# Patient Record
Sex: Female | Born: 1985 | Race: White | Hispanic: No | Marital: Married | Smoking: Never smoker
Health system: Southern US, Community
[De-identification: ages and names within clinical notes are randomized; demographics above are authoritative.]

## PROBLEM LIST (undated history)

## (undated) DIAGNOSIS — Z8742 Personal history of other diseases of the female genital tract: Secondary | ICD-10-CM

## (undated) DIAGNOSIS — N9489 Other specified conditions associated with female genital organs and menstrual cycle: Secondary | ICD-10-CM

## (undated) DIAGNOSIS — N946 Dysmenorrhea, unspecified: Secondary | ICD-10-CM

## (undated) DIAGNOSIS — F32A Depression, unspecified: Secondary | ICD-10-CM

## (undated) DIAGNOSIS — F909 Attention-deficit hyperactivity disorder, unspecified type: Secondary | ICD-10-CM

## (undated) DIAGNOSIS — Z803 Family history of malignant neoplasm of breast: Secondary | ICD-10-CM

## (undated) DIAGNOSIS — F419 Anxiety disorder, unspecified: Secondary | ICD-10-CM

## (undated) DIAGNOSIS — N941 Unspecified dyspareunia: Secondary | ICD-10-CM

## (undated) DIAGNOSIS — K219 Gastro-esophageal reflux disease without esophagitis: Secondary | ICD-10-CM

## (undated) HISTORY — DX: Family history of malignant neoplasm of breast: Z80.3

## (undated) HISTORY — PX: TUBAL LIGATION: SHX77

## (undated) HISTORY — DX: Attention-deficit hyperactivity disorder, unspecified type: F90.9

## (undated) HISTORY — DX: Depression, unspecified: F32.A

---
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
## 2005-10-29 ENCOUNTER — Emergency Department: Payer: Self-pay | Admitting: Emergency Medicine

## 2005-10-29 ENCOUNTER — Other Ambulatory Visit: Payer: Self-pay

## 2006-06-22 ENCOUNTER — Inpatient Hospital Stay: Payer: Self-pay

## 2008-07-11 ENCOUNTER — Other Ambulatory Visit: Payer: Self-pay

## 2008-07-11 ENCOUNTER — Emergency Department: Payer: Self-pay | Admitting: Emergency Medicine

## 2008-07-20 ENCOUNTER — Ambulatory Visit: Payer: Self-pay | Admitting: Family Medicine

## 2008-10-04 ENCOUNTER — Observation Stay: Payer: Self-pay | Admitting: Obstetrics & Gynecology

## 2008-11-23 ENCOUNTER — Observation Stay: Payer: Self-pay

## 2008-12-24 ENCOUNTER — Observation Stay: Payer: Self-pay | Admitting: Obstetrics and Gynecology

## 2009-01-13 ENCOUNTER — Observation Stay: Payer: Self-pay | Admitting: Obstetrics & Gynecology

## 2009-01-15 ENCOUNTER — Observation Stay: Payer: Self-pay | Admitting: Obstetrics and Gynecology

## 2009-01-25 ENCOUNTER — Observation Stay: Payer: Self-pay | Admitting: Obstetrics & Gynecology

## 2009-01-29 ENCOUNTER — Inpatient Hospital Stay: Payer: Self-pay

## 2009-09-08 IMAGING — CR DG CHEST 2V
1 series · 2 of 2 positions shown · non-contrast
Comparison: none

REASON FOR EXAM: Chest pain - Please shield abdomen
COMMENTS:

PROCEDURE:     DXR - DXR CHEST PA (OR AP) AND LATERAL  - July 11, 2008  [DATE]
RESULT:     The lungs are clear. Cardiovascular structures are unremarkable.

[Series 1: view not recorded · 0.17mm/px · 2 of 2 slices shown]
[im 1/2]
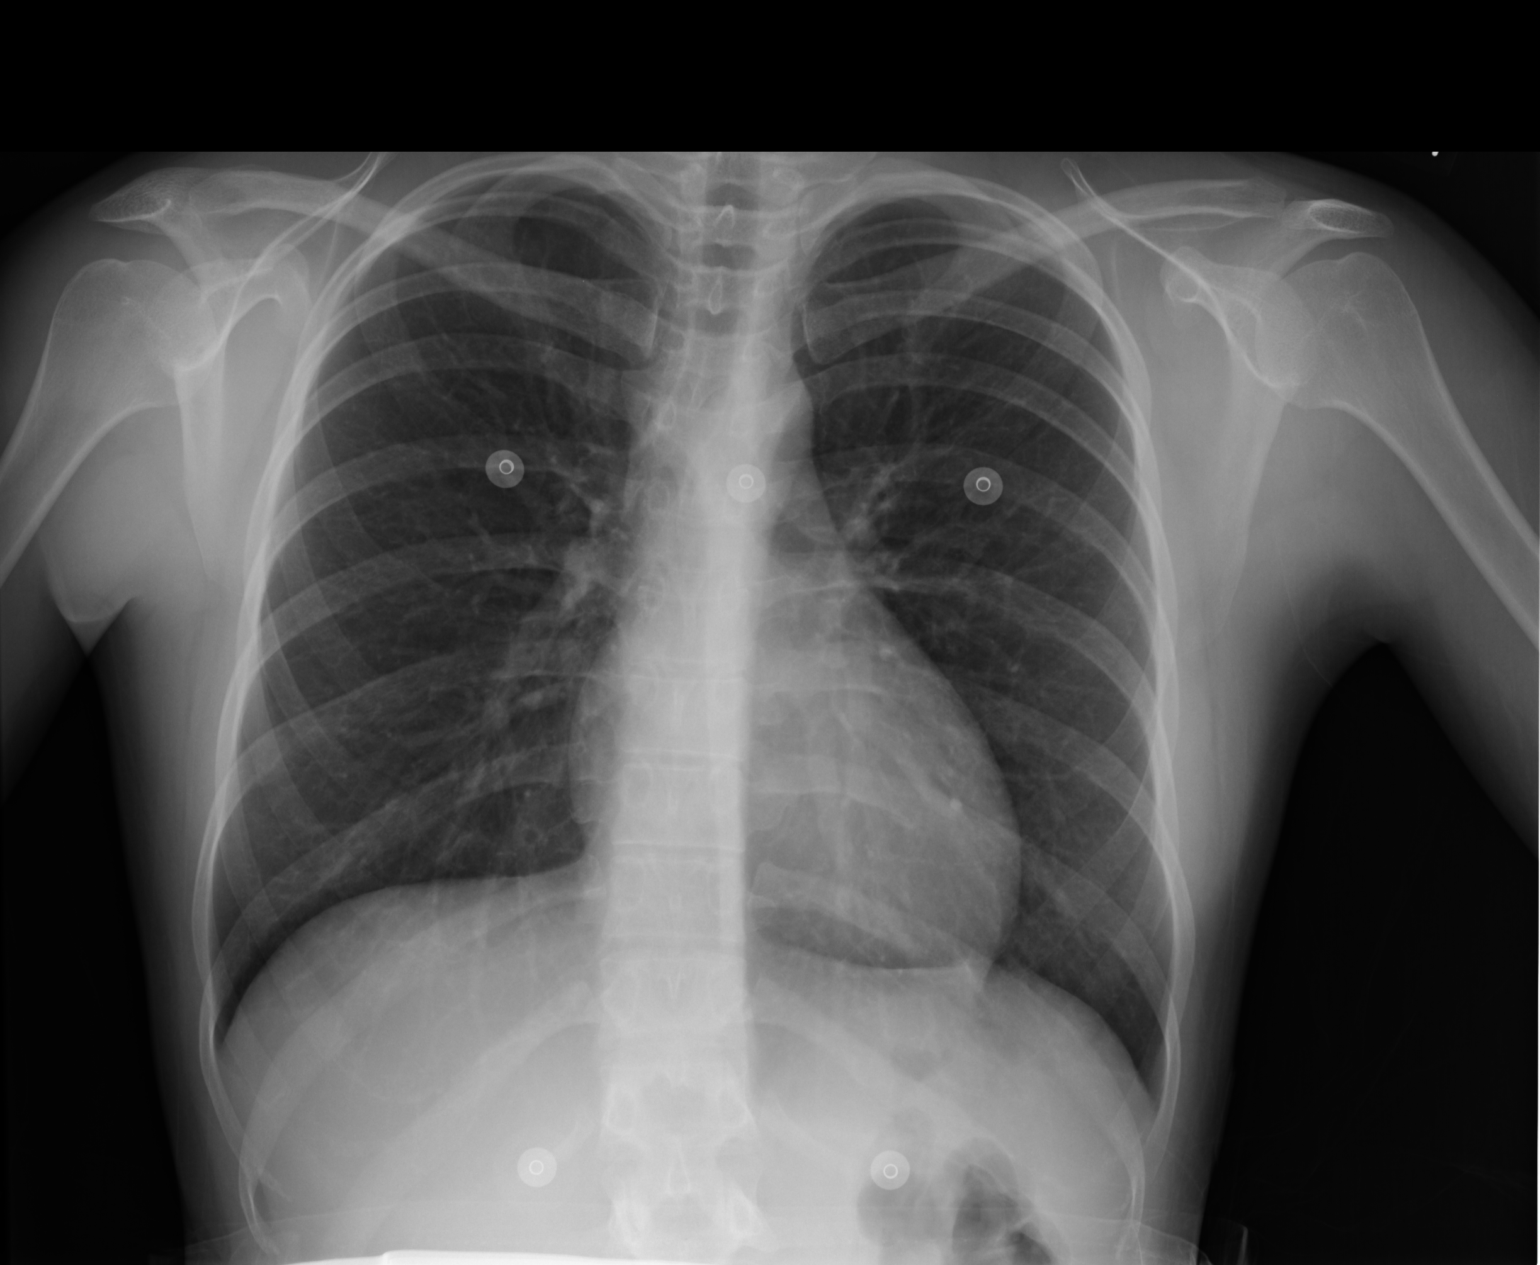
[im 2/2]
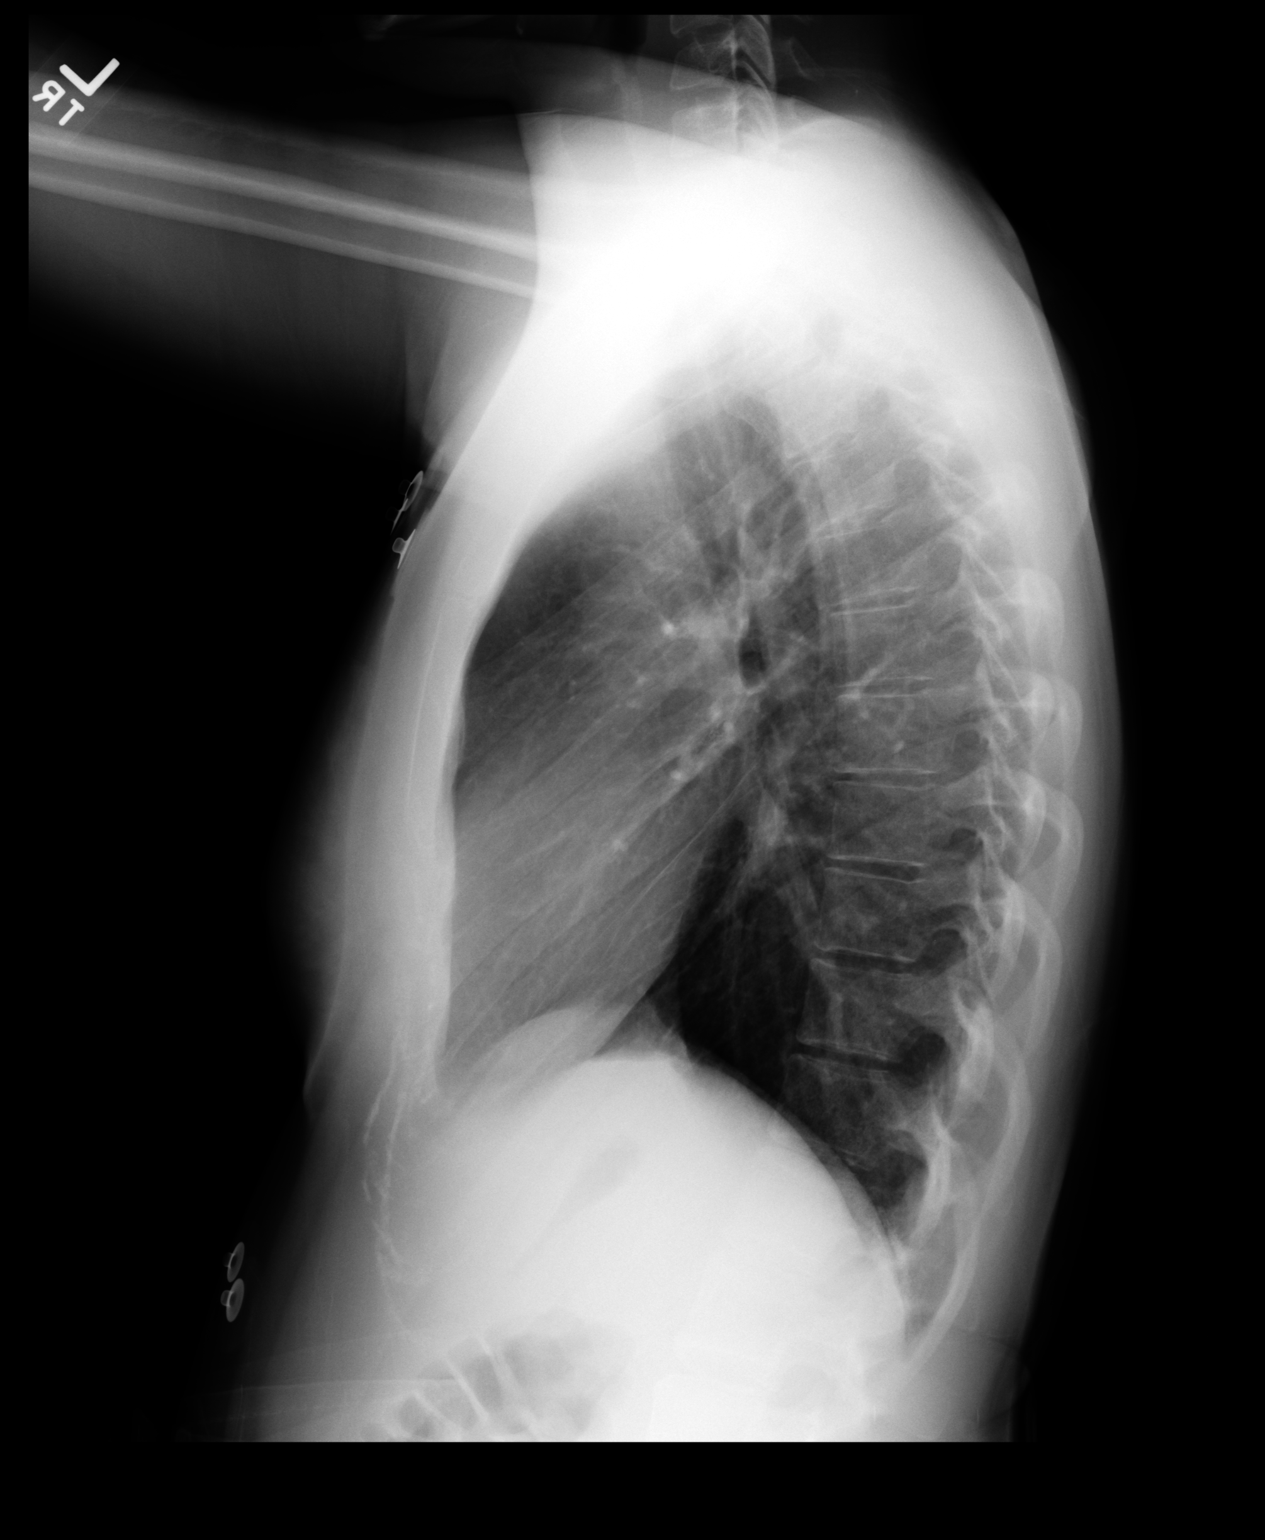

[2 of 2 positions shown; findings below may reference images not displayed]

IMPRESSION: 1.No acute cardiopulmonary disease.

## 2010-06-28 ENCOUNTER — Emergency Department: Payer: Self-pay | Admitting: Emergency Medicine

## 2014-07-30 ENCOUNTER — Emergency Department: Payer: Self-pay | Admitting: Emergency Medicine

## 2014-07-30 LAB — URINALYSIS, COMPLETE
Bacteria: NONE SEEN
Bilirubin,UR: NEGATIVE
Blood: NEGATIVE
Glucose,UR: NEGATIVE mg/dL (ref 0–75)
Ketone: NEGATIVE
Leukocyte Esterase: NEGATIVE
Nitrite: NEGATIVE
Ph: 5 (ref 4.5–8.0)
Protein: NEGATIVE
RBC,UR: 1 /HPF (ref 0–5)
SPECIFIC GRAVITY: 1.021 (ref 1.003–1.030)
Squamous Epithelial: <1
WBC UR: 1 /HPF (ref 0–5)

## 2014-07-30 LAB — CBC WITH DIFFERENTIAL/PLATELET
BASOS ABS: 0.1 10*3/uL (ref 0.0–0.1)
BASOS PCT: 0.6 %
EOS PCT: 1.9 %
Eosinophil #: 0.2 10*3/uL (ref 0.0–0.7)
HCT: 41.1 % (ref 35.0–47.0)
HGB: 13.8 g/dL (ref 12.0–16.0)
LYMPHS PCT: 17.1 %
Lymphocyte #: 1.7 10*3/uL (ref 1.0–3.6)
MCH: 30.5 pg (ref 26.0–34.0)
MCHC: 33.7 g/dL (ref 32.0–36.0)
MCV: 91 fL (ref 80–100)
MONOS PCT: 9.3 %
Monocyte #: 0.9 x10 3/mm (ref 0.2–0.9)
NEUTROS ABS: 6.9 10*3/uL — AB (ref 1.4–6.5)
Neutrophil %: 71.1 %
Platelet: 238 10*3/uL (ref 150–440)
RBC: 4.53 10*6/uL (ref 3.80–5.20)
RDW: 12.9 % (ref 11.5–14.5)
WBC: 9.7 10*3/uL (ref 3.6–11.0)

## 2014-07-30 LAB — BASIC METABOLIC PANEL
Anion Gap: 6 — ABNORMAL LOW (ref 7–16)
BUN: 15 mg/dL (ref 7–18)
CHLORIDE: 105 mmol/L (ref 98–107)
Calcium, Total: 9.3 mg/dL (ref 8.5–10.1)
Co2: 27 mmol/L (ref 21–32)
Creatinine: 0.83 mg/dL (ref 0.60–1.30)
EGFR (African American): >60
EGFR (Non-African Amer.): >60
Glucose: 96 mg/dL (ref 65–99)
Osmolality: 276 (ref 275–301)
Potassium: 3.9 mmol/L (ref 3.5–5.1)
SODIUM: 138 mmol/L (ref 136–145)

## 2014-07-30 LAB — TROPONIN I

## 2017-11-27 ENCOUNTER — Ambulatory Visit
Admission: RE | Admit: 2017-11-27 | Discharge: 2017-11-27 | Disposition: A | Discharge status: Home / Self Care | Payer: Worker's Compensation | Source: Ambulatory Visit | Attending: Physician Assistant | Admitting: Physician Assistant

## 2017-11-27 ENCOUNTER — Other Ambulatory Visit: Payer: Self-pay | Admitting: Physician Assistant

## 2017-11-27 ENCOUNTER — Ambulatory Visit: Admission: EM | Admit: 2017-11-27 | Discharge: 2017-11-27 | Discharge status: Absent without leave | Payer: Self-pay

## 2017-11-27 DIAGNOSIS — R0781 Pleurodynia: Secondary | ICD-10-CM | POA: Insufficient documentation

## 2017-11-27 DIAGNOSIS — X58XXXA Exposure to other specified factors, initial encounter: Secondary | ICD-10-CM | POA: Insufficient documentation

## 2017-11-27 DIAGNOSIS — R937 Abnormal findings on diagnostic imaging of other parts of musculoskeletal system: Secondary | ICD-10-CM | POA: Diagnosis not present

## 2017-11-27 DIAGNOSIS — T1490XA Injury, unspecified, initial encounter: Secondary | ICD-10-CM

## 2018-06-19 DIAGNOSIS — F419 Anxiety disorder, unspecified: Secondary | ICD-10-CM | POA: Insufficient documentation

## 2019-08-04 ENCOUNTER — Ambulatory Visit (INDEPENDENT_AMBULATORY_CARE_PROVIDER_SITE_OTHER): Payer: 59

## 2019-08-04 ENCOUNTER — Other Ambulatory Visit: Payer: Self-pay

## 2019-08-04 ENCOUNTER — Encounter: Payer: Self-pay | Admitting: Emergency Medicine

## 2019-08-04 ENCOUNTER — Ambulatory Visit
Admission: EM | Admit: 2019-08-04 | Discharge: 2019-08-04 | Disposition: A | Discharge status: Home / Self Care | Payer: 59 | Attending: Family Medicine | Admitting: Family Medicine

## 2019-08-04 DIAGNOSIS — M546 Pain in thoracic spine: Secondary | ICD-10-CM | POA: Diagnosis not present

## 2019-08-04 DIAGNOSIS — R0789 Other chest pain: Secondary | ICD-10-CM

## 2019-08-04 DIAGNOSIS — R0781 Pleurodynia: Secondary | ICD-10-CM | POA: Diagnosis not present

## 2019-08-04 HISTORY — DX: Anxiety disorder, unspecified: F41.9

## 2019-08-04 MED ORDER — KETOROLAC TROMETHAMINE 10 MG PO TABS: 10.0000 mg | ORAL_TABLET | Freq: Four times a day (QID) | ORAL | 0 refills | Status: DC | PRN
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
## 2019-08-04 NOTE — Discharge Instructions (Signed)
No acute fracture.  Medication as needed.  Take care  Dr. Lacinda Axon
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
## 2019-08-31 ENCOUNTER — Other Ambulatory Visit: Payer: Self-pay

## 2019-08-31 DIAGNOSIS — Z20822 Contact with and (suspected) exposure to covid-19: Secondary | ICD-10-CM

## 2019-09-02 LAB — NOVEL CORONAVIRUS, NAA: SARS-CoV-2, NAA: NOT DETECTED

## 2019-09-18 ENCOUNTER — Encounter: Payer: Self-pay | Admitting: Emergency Medicine

## 2019-09-18 ENCOUNTER — Other Ambulatory Visit: Payer: Self-pay

## 2019-09-18 ENCOUNTER — Ambulatory Visit
Admission: EM | Admit: 2019-09-18 | Discharge: 2019-09-18 | Disposition: A | Discharge status: Home / Self Care | Payer: 59 | Attending: Family Medicine | Admitting: Family Medicine

## 2019-09-18 DIAGNOSIS — Z20822 Contact with and (suspected) exposure to covid-19: Secondary | ICD-10-CM

## 2019-09-18 DIAGNOSIS — Z20828 Contact with and (suspected) exposure to other viral communicable diseases: Secondary | ICD-10-CM | POA: Diagnosis not present

## 2019-09-18 DIAGNOSIS — Z7189 Other specified counseling: Secondary | ICD-10-CM

## 2019-09-18 NOTE — ED Triage Notes (Signed)
Patient is her for COVID testing for her job.  Patient denies any symptoms

## 2019-09-18 NOTE — ED Provider Notes (Signed)
[place], [place]   Name: Nancy Walls DOB: 08-30-1986 MRN: [id number] CSN: [id number] PCP: Cyndie Mull, DO  Arrival date and time:  09/18/19 1530  Chief Complaint:  COVID exposure   NOTE: Prior to seeing the patient today, I have reviewed the triage nursing documentation and vital signs. Clinical staff has updated patient's PMH/PSHx, current medication list, and drug allergies/intolerances to ensure comprehensive history available to assist in medical decision making.   History:   HPI: Nancy Walls is a 33 y.o. female who presents today with request to be tested for SARS-CoV-2 (novel coronavirus).  Patient presents today with her husband who is symptomatic and being seen for testing today as well. Patient presents today with no symptoms; no cough, fevers, or other symptoms commonly associated with SARS-CoV-2. She advises that she feels generally well. Patient presents for testing out of concern for her personal health. She adds that she is is being required to provide documentation of negative test results before she will be allowed to return to work.   Past Medical History:  Diagnosis Date  . Anxiety     Past Surgical History:  Procedure Laterality Date  . TUBAL LIGATION      Family History  Problem Relation Age of Onset  . Mental illness Mother   . Diabetes Father   . Heart failure Father     Social History   Tobacco Use  . Smoking status: Never Smoker  . Smokeless tobacco: Never Used  Substance Use Topics  . Alcohol use: Never    Frequency: Never  . Drug use: Never    There are no active problems to display for this patient.   Home Medications:    Current Meds  Medication Sig  . busPIRone (BUSPAR) 10 MG tablet TK 1 T PO TID  . doxylamine, Sleep, (UNISOM) 25 MG tablet Take by mouth.    Allergies:   Patient has no known allergies.  Review of Systems (ROS): Review of Systems  Constitutional: Negative for fatigue and fever.  HENT: Negative  for congestion, ear pain, postnasal drip, rhinorrhea, sinus pressure, sinus pain, sneezing and sore throat.   Eyes: Negative for pain, discharge and redness.  Respiratory: Negative for cough, chest tightness and shortness of breath.   Cardiovascular: Negative for chest pain and palpitations.  Gastrointestinal: Negative for abdominal pain, diarrhea, nausea and vomiting.  Musculoskeletal: Negative for arthralgias, back pain, myalgias and neck pain.  Skin: Negative for color change, pallor and rash.  Neurological: Negative for dizziness, syncope, weakness and headaches.  Hematological: Negative for adenopathy.     Vital Signs: Today's Vitals   09/18/19 1541 09/18/19 1542 09/18/19 1618  BP: 103/72    Pulse: 74    Resp: 14    Temp: 98.2 F (36.8 C)    TempSrc: Oral    SpO2: 100%    Weight:  112 lb (50.8 kg)   Height:  5\' 2"  (1.575 m)   PainSc:  0-No pain 0-No pain    Physical Exam: Physical Exam  Constitutional: She is oriented to person, place, and time and well-developed, well-nourished, and in no distress. No distress.  HENT:  Head: Normocephalic and atraumatic.  Right Ear: Tympanic membrane normal.  Left Ear: Tympanic membrane normal.  Nose: Nose normal.  Mouth/Throat: Uvula is midline, oropharynx is clear and moist and mucous membranes are normal.  Eyes: Pupils are equal, round, and reactive to light. Conjunctivae and EOM are normal.  Neck: Normal range of motion. Neck supple.  Cardiovascular: Normal rate, regular rhythm, normal heart sounds and intact distal pulses. Exam reveals no gallop and no friction rub.  No murmur heard. Pulmonary/Chest: Effort normal. No respiratory distress. She has no decreased breath sounds. She has no wheezes. She has no rhonchi. She has no rales.  Abdominal: Soft. Normal appearance and bowel sounds are normal. She exhibits no distension. There is no abdominal tenderness.  Musculoskeletal: Normal range of motion.  Neurological: She is alert and  oriented to person, place, and time. Gait normal.  Skin: Skin is warm and dry. No rash noted. She is not diaphoretic.  Psychiatric: Mood, memory, affect and judgment normal.  Nursing note and vitals reviewed.   Urgent Care Treatments / Results:   LABS: PLEASE NOTE: all labs that were ordered this encounter are listed, however only abnormal results are displayed. Labs Reviewed  NOVEL CORONAVIRUS, NAA (HOSP ORDER, SEND-OUT TO REF LAB; TAT 18-24 HRS)    EKG: -None  RADIOLOGY: No results found.  PROCEDURES: Procedures  MEDICATIONS RECEIVED THIS VISIT: Medications - No data to display  PERTINENT CLINICAL COURSE NOTES/UPDATES:   Initial Impression / Assessment and Plan / Urgent Care Course:  Pertinent labs & imaging results that were available during my care of the patient were personally reviewed by me and considered in my medical decision making (see lab/imaging section of note for values and interpretations).  Nancy Walls is a 33 y.o. female who presents to [facility] today with complaints of COVID exposure   Patient overall well appearing and in no acute distress today in clinic. Presenting symptoms (see HPI) and exam as documented above. She presents following a direct exposure to SARS-CoV-2 (novel coronavirus). Discussed typical symptom constellation. Reviewed potential for infection with recent close contact. Given exposure and potential for infection, testing is reasonable. SARS-CoV-2 swab collected by certified clinical staff. Discussed variable turn around times associated with testing, as swabs are being processed at [facility], and have been between 2-5 days to come back. She was advised to self quarantine, per [facility] DHHS guidelines, until negative results received.   Current clinical condition warrants patient being out of work in order to quarantine while waiting for testing results. She was provided with the appropriate documentation to provide to her place of  employment that will allow for her to RTW on 09/20/2019 with no restrictions. RTW is contingent on her SARS-CoV-2 test results being reviewed as negative. Additionally, if patient's husband tests (+) for the virus, her quarantine period will need to be extended as there is close continuous contact.   Discussed follow up with primary care physician should she develop any concerning symptoms. I have reviewed the follow up and strict return precautions for any new or worsening symptoms. Patient is aware of symptoms that would be deemed urgent/emergent, and would thus require further evaluation either here or in the emergency department. At the time of discharge, he verbalized understanding and consent with the discharge plan as it was reviewed with him. All questions were fielded by provider and/or clinic staff prior to patient discharge.    Final Clinical Impressions / Urgent Care Diagnoses:   Final diagnoses:  Encounter for laboratory testing for COVID-19 virus  Advice given about COVID-19 virus infection  Exposure to COVID-19 virus    New Prescriptions:  [place] Controlled Substance Registry consulted? Not Applicable  No orders of the defined types were placed in this encounter.   Recommended Follow up Care:  Patient encouraged to follow up with the following provider within  the specified time frame, or sooner as dictated by the severity of her symptoms. As always, she was instructed that for any urgent/emergent care needs, she should seek care either here or in the emergency department for more immediate evaluation.  Follow-up Information    Cyndie MullSantayana, Gloria Patricia, DO.   Specialty: Family Medicine Why: As needed Contact information: [address] [place] [id number] [phone]         NOTE: This note was prepared using Dragon dictation software along with smaller phrase technology. Despite my best ability to proofread, there is the potential that transcriptional errors may still  occur from this process, and are completely unintentional.    Verlee MonteGray, Aleeta Schmaltz E, NP 09/18/19 [phone]

## 2019-09-18 NOTE — Discharge Instructions (Signed)
It was very nice seeing you today in clinic. Thank you for entrusting me with your care.  ° °You were tested for SARS-CoV-2 (novel coronavirus) today. Testing is performed by an outside lab ([facility]) and has variable turn around times ranging between 2-5 days. Current recommendations from the the CDC and [place] DHHS require that you remain out of work in order to quarantine at home until negative test results are have been received. In the event that your test results are positive, you will be contacted with further directives. These measures are being implemented out of an abundance of caution to prevent transmission and spread during the current SARS-CoV-2 pandemic. ° °If you develop any symptoms or concerns, make arrangements to follow up with your regular doctor. If your symptoms are severe, please seek follow up care in the ER. Please remember, our [place] providers are "right here with you" when you need us.  ° °Again, it was my pleasure to take care of you today. Thank you for choosing our clinic. I hope that you start to feel better quickly.  ° °Lynnie Koehler, MSN, APRN, FNP-C, CEN °Advanced Practice Provider °[facility] [place] [facility] °

## 2019-09-19 LAB — NOVEL CORONAVIRUS, NAA (HOSP ORDER, SEND-OUT TO REF LAB; TAT 18-24 HRS): SARS-CoV-2, NAA: NOT DETECTED

## 2019-11-26 ENCOUNTER — Ambulatory Visit
Admission: EM | Admit: 2019-11-26 | Discharge: 2019-11-26 | Disposition: A | Discharge status: Home / Self Care | Payer: 59 | Attending: Family Medicine | Admitting: Family Medicine

## 2019-11-26 ENCOUNTER — Other Ambulatory Visit: Payer: Self-pay

## 2019-11-26 DIAGNOSIS — Z20822 Contact with and (suspected) exposure to covid-19: Secondary | ICD-10-CM | POA: Diagnosis not present

## 2019-11-26 NOTE — ED Provider Notes (Signed)
[facility] ____________________________________________  Time seen: Approximately 2:30 PM  I have reviewed the triage vital signs and the nursing notes.   HISTORY  Chief Complaint COVID exposure   HPI Nancy Walls is a 34 y.o. female present for COVID-19 testing.  Patient reports she is feeling well.  Denies cough, congestion, sore throat, fevers, chest pain, shortness of breath, vomiting, diarrhea or changes in taste and smell.  Patient reports her husband has been sick for approximately 1 week with possible Covid symptoms and is currently awaiting his test result from being swabbed yesterday.  Reports that she feels well.  Denies aggravating or alleviating factors.  Patient's last menstrual period was 11/14/2019.  Denies pregnancy.    Past Medical History:  Diagnosis Date  . Anxiety     There are no problems to display for this patient.   Past Surgical History:  Procedure Laterality Date  . TUBAL LIGATION       No current facility-administered medications for this encounter.  Current Outpatient Medications:  .  busPIRone (BUSPAR) 10 MG tablet, TK 1 T PO TID, Disp: , Rfl:  .  doxylamine, Sleep, (UNISOM) 25 MG tablet, Take by mouth., Disp: , Rfl:   Allergies Patient has no known allergies.  Family History  Problem Relation Age of Onset  . Mental illness Mother   . Diabetes Father   . Heart failure Father     Social History Social History   Tobacco Use  . Smoking status: Never Smoker  . Smokeless tobacco: Never Used  Substance Use Topics  . Alcohol use: Never  . Drug use: Never    Review of Systems Constitutional: No fever ENT: No sore throat. Cardiovascular: Denies chest pain. Respiratory: Denies shortness of breath. Gastrointestinal: No abdominal pain.  No nausea, no vomiting.  No diarrhea.   Musculoskeletal: Negative for back pain. Skin: Negative for rash.   ____________________________________________   PHYSICAL  EXAM:  VITAL SIGNS: ED Triage Vitals  Enc Vitals Group     BP 11/26/19 1317 101/64     Pulse Rate 11/26/19 1317 69     Resp --      Temp 11/26/19 1317 98.6 F (37 C)     Temp Source 11/26/19 1317 Oral     SpO2 11/26/19 1317 100 %     Weight 11/26/19 1314 112 lb (50.8 kg)     Height 11/26/19 1314 5\' 2"  (1.575 m)     Head Circumference --      Peak Flow --      Pain Score 11/26/19 1314 0     Pain Loc --      Pain Edu? --      Excl. in [place]? --     Constitutional: Alert and oriented. Well appearing and in no acute distress. Eyes: Conjunctivae are normal. ENT      Head: Normocephalic and atraumatic. Cardiovascular: Normal rate, regular rhythm. Grossly normal heart sounds.  Good peripheral circulation. Respiratory: Normal respiratory effort without tachypnea nor retractions. Breath sounds are clear and equal bilaterally. No wheezes, rales, rhonchi. Musculoskeletal: Steady gait. Neurologic:  Normal speech and language. Speech is normal. No gait instability.  Skin:  Skin is warm, dry and intact. No rash noted. Psychiatric: Mood and affect are normal. Speech and behavior are normal. Patient exhibits appropriate insight and judgment   ___________________________________________   LABS (all labs ordered are listed, but only abnormal results are displayed)  Labs Reviewed  NOVEL CORONAVIRUS, NAA (HOSP ORDER, SEND-OUT TO REF LAB;  TAT 18-24 HRS)     PROCEDURES Procedures   INITIAL IMPRESSION / ASSESSMENT AND PLAN / ED COURSE  Pertinent labs & imaging results that were available during my care of the patient were reviewed by me and considered in my medical decision making (see chart for details).  Well-appearing patient.  Asymptomatic.  COVID-19 testing completed advice given.  Monitor and supportive care.  Work note given.  Discussed follow up and return parameters including no resolution or any worsening concerns. Patient verbalized understanding and agreed to plan.    ____________________________________________   FINAL CLINICAL IMPRESSION(S) / ED DIAGNOSES  Final diagnoses:  Encounter for screening laboratory testing for COVID-19 virus     ED Discharge Orders    None       Note: This dictation was prepared with Dragon dictation along with smaller phrase technology. Any transcriptional errors that result from this process are unintentional.         Renford Dills, NP 11/26/19 1431

## 2019-11-26 NOTE — ED Triage Notes (Signed)
Pt presents for COVID testing. Her husband is symptomatic and was tested yesterday. She does not currently have any symptoms. She reports her husband has been sick for about one week.

## 2019-11-27 LAB — NOVEL CORONAVIRUS, NAA (HOSP ORDER, SEND-OUT TO REF LAB; TAT 18-24 HRS): SARS-CoV-2, NAA: NOT DETECTED

## 2020-11-30 ENCOUNTER — Encounter: Payer: Self-pay | Admitting: Emergency Medicine

## 2020-11-30 ENCOUNTER — Other Ambulatory Visit: Payer: Self-pay

## 2020-11-30 ENCOUNTER — Ambulatory Visit
Admission: EM | Admit: 2020-11-30 | Discharge: 2020-11-30 | Disposition: A | Discharge status: Home / Self Care | Payer: 59 | Attending: Physician Assistant | Admitting: Physician Assistant

## 2020-11-30 DIAGNOSIS — R110 Nausea: Secondary | ICD-10-CM | POA: Diagnosis present

## 2020-11-30 DIAGNOSIS — R1032 Left lower quadrant pain: Secondary | ICD-10-CM | POA: Insufficient documentation

## 2020-11-30 DIAGNOSIS — R300 Dysuria: Secondary | ICD-10-CM | POA: Diagnosis present

## 2020-11-30 DIAGNOSIS — Z20822 Contact with and (suspected) exposure to covid-19: Secondary | ICD-10-CM | POA: Diagnosis not present

## 2020-11-30 DIAGNOSIS — R1031 Right lower quadrant pain: Secondary | ICD-10-CM | POA: Diagnosis not present

## 2020-11-30 DIAGNOSIS — R0989 Other specified symptoms and signs involving the circulatory and respiratory systems: Secondary | ICD-10-CM | POA: Diagnosis not present

## 2020-11-30 LAB — URINALYSIS, COMPLETE (UACMP) WITH MICROSCOPIC
Bilirubin Urine: NEGATIVE
Glucose, UA: NEGATIVE mg/dL
Ketones, ur: NEGATIVE mg/dL
Nitrite: NEGATIVE
Protein, ur: NEGATIVE mg/dL
Specific Gravity, Urine: 1.015 (ref 1.005–1.030)
pH: 6.5 (ref 5.0–8.0)

## 2020-11-30 MED ORDER — ONDANSETRON HCL 8 MG PO TABS: 8.0000 mg | ORAL_TABLET | Freq: Three times a day (TID) | ORAL | 0 refills | Status: DC | PRN

## 2020-11-30 MED ORDER — NITROFURANTOIN MONOHYD MACRO 100 MG PO CAPS: 100.0000 mg | ORAL_CAPSULE | Freq: Two times a day (BID) | ORAL | 0 refills | Status: AC

## 2020-11-30 NOTE — ED Triage Notes (Signed)
Patient c/o lower abdominal pain and dysuria that started a week ago.  Patient reports runny nose and Has that started 3 days ago.  Patient denies fevers.

## 2020-11-30 NOTE — Discharge Instructions (Addendum)
Recommend start Macrobid 100mg  (antibiotic) twice a day as directed. May take Zofran 8mg  every 8 hours as needed for nausea. Recommend increase fluids, particularly water or Cranberry juice, to help with symptoms. Rest. Stay at home. May also take Ibuprofen 600mg  every 8 hours as needed for pain. Follow-up pending COVID 19 test results and your urine culture results.

## 2020-11-30 NOTE — ED Provider Notes (Signed)
[facility]    CSN: [id number] Arrival date & time: 11/30/20  1009      History   Chief Complaint Chief Complaint  Patient presents with  . Abdominal Pain  . Dysuria    HPI Nancy Walls is a 35 y.o. female.   36 year old female presents with lower abdominal pain and discomfort when urinating for about 1 week. Improved slightly when taking AZO but now getting worse. Also having some nausea but no vomiting or diarrhea. Has noticed slight increase in vaginal discharge but no distinct odor. LMP 12/23 and regular. Has had a tubal ligation. Last UTI over 12 years ago. Also concerned over possible COVID 19 since she started having a runny nose and congestion about 3 days ago. Denies any fever, sore throat or cough. Multiple people positive for COVID 19 at her work. Has been vaccinated. Other chronic health issues include insomnia. Currently takes OTC Doxylamine (Unisom) daily.   The history is provided by the patient.    Past Medical History:  Diagnosis Date  . Anxiety     There are no problems to display for this patient.   Past Surgical History:  Procedure Laterality Date  . TUBAL LIGATION      OB History   No obstetric history on file.      Home Medications    Prior to Admission medications   Medication Sig Start Date End Date Taking? Authorizing Provider  doxylamine, Sleep, (UNISOM) 25 MG tablet Take by mouth.   Yes [provider]  nitrofurantoin, macrocrystal-monohydrate, (MACROBID) 100 MG capsule Take 1 capsule (100 mg total) by mouth 2 (two) times daily for 5 days. 11/30/20 12/05/20 Yes Hayzlee Mcsorley, Ali Lowe, NP  ondansetron (ZOFRAN) 8 MG tablet Take 1 tablet (8 mg total) by mouth every 8 (eight) hours as needed for nausea or vomiting. 11/30/20  Yes Delonda Coley, Ali Lowe, NP    Family History Family History  Problem Relation Age of Onset  . Mental illness Mother   . Diabetes Father   . Heart failure Father     Social History Social History    Tobacco Use  . Smoking status: Never Smoker  . Smokeless tobacco: Never Used  Vaping Use  . Vaping Use: Never used  Substance Use Topics  . Alcohol use: Never  . Drug use: Never     Allergies   Patient has no known allergies.   Review of Systems Review of Systems  Constitutional: Positive for appetite change, chills and fatigue. Negative for diaphoresis and fever.  HENT: Positive for congestion, postnasal drip and rhinorrhea. Negative for ear discharge, ear pain, facial swelling, mouth sores, sinus pressure, sinus pain, sore throat and trouble swallowing.   Eyes: Negative for pain, discharge, redness and itching.  Respiratory: Negative for cough, chest tightness, shortness of breath and wheezing.   Gastrointestinal: Positive for abdominal pain and nausea. Negative for diarrhea and vomiting.  Genitourinary: Positive for decreased urine volume, dysuria and frequency. Negative for flank pain, genital sores, hematuria, menstrual problem, pelvic pain, urgency, vaginal bleeding and vaginal pain.  Musculoskeletal: Positive for back pain. Negative for arthralgias, neck pain and neck stiffness.  Skin: Negative for color change and rash.  Allergic/Immunologic: Negative for environmental allergies, food allergies and immunocompromised state.  Neurological: Positive for weakness, light-headedness and headaches. Negative for dizziness, tremors, seizures, syncope and numbness.  Hematological: Negative for adenopathy. Does not bruise/bleed easily.  Psychiatric/Behavioral: Positive for sleep disturbance.     Physical Exam Triage Vital Signs  ED Triage Vitals  Enc Vitals Group     BP 11/30/20 1045 117/77     Pulse Rate 11/30/20 1045 73     Resp 11/30/20 1045 14     Temp 11/30/20 1045 98 F (36.7 C)     Temp Source 11/30/20 1045 Oral     SpO2 11/30/20 1045 100 %     Weight 11/30/20 1042 112 lb (50.8 kg)     Height 11/30/20 1042 5\' 2"  (1.575 m)     Head Circumference --      Peak Flow  --      Pain Score 11/30/20 1042 0     Pain Loc --      Pain Edu? --      Excl. in GC? --    No data found.  Updated Vital Signs BP 117/77 (BP Location: Left Arm)   Pulse 73   Temp 98 F (36.7 C) (Oral)   Resp 14   Ht 5\' 2"  (1.575 m)   Wt 112 lb (50.8 kg)   LMP 11/09/2020 (Exact Date)   SpO2 100%   BMI 20.49 kg/m   Visual Acuity Right Eye Distance:   Left Eye Distance:   Bilateral Distance:    Right Eye Near:   Left Eye Near:    Bilateral Near:     Physical Exam Vitals and nursing note reviewed.  Constitutional:      General: She is awake. She is not in acute distress.    Appearance: She is well-developed and well-groomed.     Comments: She is sitting on the exam table in no acute distress but appears uncomfortable due to pain and congestion.   HENT:     Head: Normocephalic and atraumatic.     Right Ear: Hearing, tympanic membrane, ear canal and external ear normal.     Left Ear: Hearing, tympanic membrane, ear canal and external ear normal.     Nose: Congestion present.     Right Turbinates: Swollen.     Left Turbinates: Swollen.     Right Sinus: No maxillary sinus tenderness or frontal sinus tenderness.     Left Sinus: No maxillary sinus tenderness or frontal sinus tenderness.     Mouth/Throat:     Lips: Pink.     Mouth: Mucous membranes are moist.     Pharynx: Oropharynx is clear. Uvula midline. No pharyngeal swelling, oropharyngeal exudate, posterior oropharyngeal erythema or uvula swelling.  Eyes:     Extraocular Movements: Extraocular movements intact.     Conjunctiva/sclera: Conjunctivae normal.  Cardiovascular:     Rate and Rhythm: Normal rate and regular rhythm.     Heart sounds: Normal heart sounds. No murmur heard.   Pulmonary:     Effort: Pulmonary effort is normal. No respiratory distress.     Breath sounds: Normal breath sounds and air entry. No decreased air movement. No decreased breath sounds, wheezing, rhonchi or rales.  Abdominal:      General: Abdomen is flat. Bowel sounds are normal.     Palpations: Abdomen is soft.     Tenderness: There is abdominal tenderness in the right lower quadrant, suprapubic area and left lower quadrant. There is left CVA tenderness. There is no right CVA tenderness, guarding or rebound.    Musculoskeletal:        General: Normal range of motion.     Cervical back: Normal range of motion and neck supple. No rigidity.  Lymphadenopathy:     Cervical: No cervical adenopathy.  Skin:    General: Skin is warm and dry.     Capillary Refill: Capillary refill takes less than 2 seconds.     Findings: No rash.  Neurological:     General: No focal deficit present.     Mental Status: She is alert and oriented to person, place, and time.  Psychiatric:        Mood and Affect: Mood normal.        Behavior: Behavior normal. Behavior is cooperative.        Thought Content: Thought content normal.        Judgment: Judgment normal.      UC Treatments / Results  Labs (all labs ordered are listed, but only abnormal results are displayed) Labs Reviewed  URINALYSIS, COMPLETE (UACMP) WITH MICROSCOPIC - Abnormal; Notable for the following components:      Result Value   Hgb urine dipstick TRACE (*)    Leukocytes,Ua SMALL (*)    Bacteria, UA FEW (*)    All other components within normal limits  SARS CORONAVIRUS 2 (TAT 6-24 HRS)  URINE CULTURE    EKG   Radiology No results found.  Procedures Procedures (including critical care time)  Medications Ordered in UC Medications - No data to display  Initial Impression / Assessment and Plan / UC Course  I have reviewed the triage vital signs and the nursing notes.  Pertinent labs & imaging results that were available during my care of the patient were reviewed by me and considered in my medical decision making (see chart for details).    Reviewed urinalysis results with patient- possible UTI. Will start Macrobid 100mg  twice a day as directed. Will  send urine for culture. May take Zofran 8mg  every 8 hours as needed for nausea. Recommend increase fluids, especially water to help with symptoms. May take Ibuprofen 600mg  every 8 hours as needed for pain. Rest. Stay at home. Note written for work. Follow-up pending urine culture results and COVID 19 test results.  Final Clinical Impressions(s) / UC Diagnoses   Final diagnoses:  Dysuria  Acute bilateral lower abdominal pain  Runny nose  Nausea without vomiting     Discharge Instructions     Recommend start Macrobid 100mg  (antibiotic) twice a day as directed. May take Zofran 8mg  every 8 hours as needed for nausea. Recommend increase fluids, particularly water or Cranberry juice, to help with symptoms. Rest. Stay at home. May also take Ibuprofen 600mg  every 8 hours as needed for pain. Follow-up pending COVID 19 test results and your urine culture results.     ED Prescriptions    Medication Sig Dispense Auth. Provider   nitrofurantoin, macrocrystal-monohydrate, (MACROBID) 100 MG capsule Take 1 capsule (100 mg total) by mouth 2 (two) times daily for 5 days. 10 capsule , NP   ondansetron (ZOFRAN) 8 MG tablet Take 1 tablet (8 mg total) by mouth every 8 (eight) hours as needed for nausea or vomiting. 20 tablet Kavari Parrillo, , NP     PDMP not reviewed this encounter.   , NP 11/30/20 2120

## 2020-12-01 LAB — SARS CORONAVIRUS 2 (TAT 6-24 HRS): SARS Coronavirus 2: NEGATIVE

## 2020-12-03 LAB — URINE CULTURE
Culture: >=100000 — AB
Special Requests: NORMAL

## 2022-04-29 DIAGNOSIS — Z8742 Personal history of other diseases of the female genital tract: Secondary | ICD-10-CM | POA: Insufficient documentation

## 2022-07-05 DIAGNOSIS — N926 Irregular menstruation, unspecified: Secondary | ICD-10-CM | POA: Insufficient documentation

## 2022-07-05 DIAGNOSIS — N9489 Other specified conditions associated with female genital organs and menstrual cycle: Secondary | ICD-10-CM | POA: Insufficient documentation

## 2022-08-14 HISTORY — PX: OTHER SURGICAL HISTORY: SHX169

## 2023-12-22 ENCOUNTER — Telehealth: Payer: Self-pay | Admitting: Family Medicine

## 2023-12-22 NOTE — Telephone Encounter (Signed)
 Created in error

## 2024-01-16 ENCOUNTER — Encounter: Payer: Self-pay | Admitting: Family Medicine

## 2024-01-16 ENCOUNTER — Ambulatory Visit (INDEPENDENT_AMBULATORY_CARE_PROVIDER_SITE_OTHER): Payer: BC Managed Care – PPO | Admitting: Family Medicine

## 2024-01-16 VITALS — BP 105/70 | HR 71 | Temp 98.8°F | Resp 18 | Ht 62.0 in | Wt 114.0 lb

## 2024-01-16 DIAGNOSIS — N9489 Other specified conditions associated with female genital organs and menstrual cycle: Secondary | ICD-10-CM

## 2024-01-16 DIAGNOSIS — Z7689 Persons encountering health services in other specified circumstances: Secondary | ICD-10-CM

## 2024-01-16 DIAGNOSIS — F5101 Primary insomnia: Secondary | ICD-10-CM

## 2024-01-16 DIAGNOSIS — N926 Irregular menstruation, unspecified: Secondary | ICD-10-CM

## 2024-01-16 MED ORDER — TRAZODONE HCL 50 MG PO TABS: 25.0000 mg | ORAL_TABLET | Freq: Every day | ORAL | 0 refills | Status: DC

## 2024-01-16 NOTE — Patient Instructions (Signed)

## 2024-01-16 NOTE — Progress Notes (Signed)
 New Patient Office Visit  Subjective   Patient ID: Nancy Walls, female    DOB: 08-03-1986  Age: 38 y.o. MRN: [id number]  CC:  Chief Complaint  Patient presents with   Establish Care   HPI Nancy Walls is a 38 year old female who presents to establish with [facility] at [facility].   CC: Patient here to establish care  Last PCP: [facility] Specialists: OBGYN PMH: anxiety, depression,  irregular menstrual cycles, history of ovarian cysts, and pelvic congestion syndrome   [facility]- endometriosis  Enlarged vein through uterus- embolization  Still has 80% of the issues  R ovary is the worst 2/10 pain  Week before and after menstrual cycle  History of ruptured ovarian cysts   Sleep- currently taking double the dose of recommended Unisom  Trouble falling asleep  She did cut out a lot of caffeine but has not helped. No longer consumes caffeine in the afternoon.      01/16/2024    9:49 AM  GAD 7 : Generalized Anxiety Score  Nervous, Anxious, on Edge 0  Control/stop worrying 0  Worry too much - different things 0  Trouble relaxing 0  Restless 0  Easily annoyed or irritable 0  Afraid - awful might happen 0  Total GAD 7 Score 0  Anxiety Difficulty Not difficult at all       01/16/2024    9:49 AM  PHQ9 SCORE ONLY  PHQ-9 Total Score 4    Outpatient Encounter Medications as of 01/16/2024  Medication Sig   doxylamine, Sleep, (UNISOM) 25 MG tablet Take by mouth.   traZODone (DESYREL) 50 MG tablet Take 0.5 tablets (25 mg total) by mouth at bedtime.   [DISCONTINUED] ondansetron (ZOFRAN) 8 MG tablet Take 1 tablet (8 mg total) by mouth every 8 (eight) hours as needed for nausea or vomiting.   No facility-administered encounter medications on file as of 01/16/2024.    Patient Active Problem List   Diagnosis Date Noted   Irregular periods/menstrual cycles 07/05/2022   Pelvic congestion syndrome 07/05/2022   History of ovarian cyst 04/29/2022   Anxiety and depression  06/19/2018   Past Medical History:  Diagnosis Date   Anxiety    Past Surgical History:  Procedure Laterality Date   TUBAL LIGATION     Family History  Problem Relation Age of Onset   Mental illness Mother    Diabetes Father    Heart failure Father    Social History   Socioeconomic History   Marital status: Married    Spouse name: Not on file   Number of children: Not on file   Years of education: Not on file   Highest education level: Not on file  Occupational History   Not on file  Tobacco Use   Smoking status: Never    Passive exposure: Never   Smokeless tobacco: Never  Vaping Use   Vaping status: Never Used  Substance and Sexual Activity   Alcohol use: Never   Drug use: Never   Sexual activity: Not on file    Comment: tubal ligation  Other Topics Concern   Not on file  Social History Narrative   Not on file   Social Drivers of Health   Financial Resource Strain: Low Risk  (04/29/2022)   Received from [facility], [facility]   Overall Financial Resource Strain (CARDIA)    Difficulty of Paying Living Expenses: Not hard at all  Food Insecurity: No Food Insecurity (04/29/2022)  Received from [facility], [facility]   Hunger Vital Sign    Worried About Running Out of Food in the Last Year: Never true    Ran Out of Food in the Last Year: Never true  Transportation Needs: No Transportation Needs (04/29/2022)   Received from [facility], [facility]   [facility] - Transportation    Lack of Transportation (Medical): No    Lack of Transportation (Non-Medical): No  Physical Activity: Not on file  Stress: Not on file  Social Connections: Not on file  Intimate Partner Violence: Not on file   Outpatient Medications Prior to Visit  Medication Sig Dispense Refill   doxylamine, Sleep, (UNISOM) 25 MG tablet Take by mouth.     ondansetron (ZOFRAN) 8 MG tablet Take 1 tablet (8 mg total) by mouth every 8 (eight) hours as needed for nausea or  vomiting. 20 tablet 0   No facility-administered medications prior to visit.   No Known Allergies   ROS      Objective   Today's Vitals   01/16/24 0944  BP: 105/70  Pulse: 71  Resp: 18  Temp: 98.8 F (37.1 C)  TempSrc: Oral  SpO2: 99%  Weight: 114 lb (51.7 kg)  Height: 5\' 2"  (1.575 m)  PainSc: 1   PainLoc: Abdomen    GENERAL: Well-appearing, in NAD. Well nourished.  SKIN: Pink, warm and dry. No rash, lesion, ulceration, or ecchymoses.  Head: Normocephalic. RESPIRATORY: Chest wall symmetrical. Respirations even and non-labored. Breath sounds clear to auscultation bilaterally.  CARDIAC: S1, S2 present, regular rate and rhythm without murmur or gallops. Peripheral pulses 2+ bilaterally.  MSK: Muscle tone and strength appropriate for age. Joints w/o tenderness, redness, or swelling.  EXTREMITIES: Without clubbing, cyanosis, or edema.  NEUROLOGIC: No motor or sensory deficits. Steady, even gait. C2-C12 intact.  PSYCH/MENTAL STATUS: Alert, oriented x 3. Cooperative, appropriate mood and affect.     Assessment & Plan:   1. Encounter to establish care (Primary) Patient is a 38- year-old female who presents today to establish care with primary care at [facility]. Reviewed the past medical history, family history, social history, surgical history, medications and allergies today- updates made as indicated. Patient has concerns today about insomnia and ongoing abdominal pain due to pelvic congestion syndrome.   2. Primary insomnia Discussed adequate sleep hygiene routine, limiting caffeine, stress management, and use of trazodone as needed. Patient is agreeable to plan of care. Reports she was on Ambien in the past and did not like the side effects from this medication. Rx sent to pharmacy. Follow-up in 4-6 weeks.  - traZODone (DESYREL) 50 MG tablet; Take 0.5 tablets (25 mg total) by mouth at bedtime.  Dispense: 45 tablet; Refill: 0  3. Pelvic congestion syndrome Extensive  history with metrorrhagia and abdominal pain. Review of notes from [facility]- diagnosed via CT scan on 06/23/2022. Patient underwent embolization of an enlarged vein through uterus but reports she still experiences abdominal and ovarian pain on a daily basis. Patient would like to undergo hysterectomy. Referral to OBGYN placed.  - Ambulatory referral to Obstetrics / Gynecology  4. Irregular periods/menstrual cycles History of ovarian cysts and irregular menstrual cycles. OBGYN referral placed.  - Ambulatory referral to Obstetrics / Gynecology   Return in about 6 weeks (around 02/27/2024) for sleep & mood f/u .   Alyson Reedy, FNP

## 2024-02-25 NOTE — Progress Notes (Unsigned)
    Alyson Reedy, FNP   No chief complaint on file.   HPI:      [name] is a 38 y.o. No obstetric history on file. whose LMP was No LMP recorded., presents today for NP eval Neg pap 6/23 [facility] ???? HPV   Patient Active Problem List   Diagnosis Date Noted   Irregular periods/menstrual cycles 07/05/2022   Pelvic congestion syndrome 07/05/2022   History of ovarian cyst 04/29/2022   Anxiety and depression 06/19/2018    Past Surgical History:  Procedure Laterality Date   TUBAL LIGATION      Family History  Problem Relation Age of Onset   Mental illness Mother    Diabetes Father    Heart failure Father     Social History   Socioeconomic History   Marital status: Married    Spouse name: Not on file   Number of children: Not on file   Years of education: Not on file   Highest education level: Not on file  Occupational History   Not on file  Tobacco Use   Smoking status: Never    Passive exposure: Never   Smokeless tobacco: Never  Vaping Use   Vaping status: Never Used  Substance and Sexual Activity   Alcohol use: Never   Drug use: Never   Sexual activity: Not on file    Comment: tubal ligation  Other Topics Concern   Not on file  Social History Narrative   Not on file   Social Drivers of Health   Financial Resource Strain: Low Risk  (04/29/2022)   Received from [facility], [facility]   Overall Financial Resource Strain (CARDIA)    Difficulty of Paying Living Expenses: Not hard at all  Food Insecurity: No Food Insecurity (04/29/2022)   Received from [facility], [facility]   Hunger Vital Sign    Worried About Running Out of Food in the Last Year: Never true    Ran Out of Food in the Last Year: Never true  Transportation Needs: No Transportation Needs (04/29/2022)   Received from [facility], [facility]   [facility] - Transportation    Lack of Transportation (Medical): No    Lack of Transportation (Non-Medical): No   Physical Activity: Not on file  Stress: Not on file  Social Connections: Not on file  Intimate Partner Violence: Not on file    Outpatient Medications Prior to Visit  Medication Sig Dispense Refill   doxylamine, Sleep, (UNISOM) 25 MG tablet Take by mouth.     traZODone (DESYREL) 50 MG tablet Take 0.5 tablets (25 mg total) by mouth at bedtime. 45 tablet 0   No facility-administered medications prior to visit.      ROS:  Review of Systems BREAST: No symptoms   OBJECTIVE:   Vitals:  There were no vitals taken for this visit.  Physical Exam  Results: No results found for this or any previous visit (from the past 24 hours).   Assessment/Plan: No diagnosis found.    No orders of the defined types were placed in this encounter.     No follow-ups on file.  Aneesha Holloran B. Jaedan Huttner, PA-C 02/25/2024 5:36 PM

## 2024-02-26 ENCOUNTER — Encounter: Payer: Self-pay | Admitting: Obstetrics and Gynecology

## 2024-02-26 ENCOUNTER — Ambulatory Visit: Payer: BC Managed Care – PPO | Admitting: Family Medicine

## 2024-02-26 ENCOUNTER — Ambulatory Visit (INDEPENDENT_AMBULATORY_CARE_PROVIDER_SITE_OTHER): Payer: BC Managed Care – PPO | Admitting: Obstetrics and Gynecology

## 2024-02-26 VITALS — BP 104/66 | Ht 62.0 in | Wt 114.0 lb

## 2024-02-26 DIAGNOSIS — R1031 Right lower quadrant pain: Secondary | ICD-10-CM | POA: Diagnosis not present

## 2024-02-26 DIAGNOSIS — G8929 Other chronic pain: Secondary | ICD-10-CM

## 2024-02-26 DIAGNOSIS — R102 Pelvic and perineal pain: Secondary | ICD-10-CM | POA: Diagnosis not present

## 2024-02-26 DIAGNOSIS — N921 Excessive and frequent menstruation with irregular cycle: Secondary | ICD-10-CM | POA: Diagnosis not present

## 2024-02-26 NOTE — Patient Instructions (Signed)
 I value your feedback and you entrusting [place] with your care. If you get a [place] patient survey, I would appreciate you taking the time to let us know about your experience today. Thank you! ? ? ?

## 2024-02-27 LAB — CBC WITH DIFFERENTIAL/PLATELET
Basophils Absolute: 0 10*3/uL (ref 0.0–0.2)
Basos: 1 %
EOS (ABSOLUTE): 0.1 10*3/uL (ref 0.0–0.4)
Eos: 3 %
Hematocrit: 39.1 % (ref 34.0–46.6)
Hemoglobin: 12.8 g/dL (ref 11.1–15.9)
Immature Grans (Abs): 0 10*3/uL (ref 0.0–0.1)
Immature Granulocytes: 0 %
Lymphocytes Absolute: 1 10*3/uL (ref 0.7–3.1)
Lymphs: 30 %
MCH: 30.7 pg (ref 26.6–33.0)
MCHC: 32.7 g/dL (ref 31.5–35.7)
MCV: 94 fL (ref 79–97)
Monocytes Absolute: 0.4 10*3/uL (ref 0.1–0.9)
Monocytes: 11 %
Neutrophils Absolute: 1.8 10*3/uL (ref 1.4–7.0)
Neutrophils: 55 %
Platelets: 256 10*3/uL (ref 150–450)
RBC: 4.17 x10E6/uL (ref 3.77–5.28)
RDW: 11.9 % (ref 11.7–15.4)
WBC: 3.3 10*3/uL — ABNORMAL LOW (ref 3.4–10.8)

## 2024-02-28 ENCOUNTER — Encounter: Payer: Self-pay | Admitting: Obstetrics and Gynecology

## 2024-03-01 ENCOUNTER — Encounter: Payer: Self-pay | Admitting: Family Medicine

## 2024-03-01 ENCOUNTER — Ambulatory Visit (INDEPENDENT_AMBULATORY_CARE_PROVIDER_SITE_OTHER): Admitting: Family Medicine

## 2024-03-01 VITALS — BP 96/61 | HR 75 | Ht 62.0 in | Wt 114.0 lb

## 2024-03-01 DIAGNOSIS — F5101 Primary insomnia: Secondary | ICD-10-CM | POA: Diagnosis not present

## 2024-03-01 MED ORDER — TRAZODONE HCL 50 MG PO TABS: 50.0000 mg | ORAL_TABLET | Freq: Every day | ORAL | 1 refills | Status: DC

## 2024-03-01 NOTE — Progress Notes (Signed)
   Established Patient Office Visit  Subjective  Patient ID: Nancy Walls, female    DOB: May 26, 1986  Age: 38 y.o. MRN: [id number]  Chief Complaint  Patient presents with   Medical Management of Chronic Issues    6-week follow up; pt is here following up to see how sleeping has been going and also for a mood follow up. States that the 25mg  did not work so she did end up having to take a full tablet of 50mg .    Nancy Walls is a 38 year old female patient who presents for follow-up regarding insomnia.  Has noticed an improvement in her sleep- she is able to fall asleep and stay asleep on 50mg  at bedtime.      03/01/2024    3:29 PM 01/16/2024    9:49 AM  PHQ9 SCORE ONLY  PHQ-9 Total Score 2 4      03/01/2024    3:30 PM 01/16/2024    9:49 AM  GAD 7 : Generalized Anxiety Score  Nervous, Anxious, on Edge 0 0  Control/stop worrying 0 0  Worry too much - different things 0 0  Trouble relaxing 0 0  Restless 0 0  Easily annoyed or irritable 0 0  Afraid - awful might happen 0 0  Total GAD 7 Score 0 0  Anxiety Difficulty Not difficult at all Not difficult at all   ROS: see HPI    Objective:     BP 96/61 (BP Location: Left Arm, Patient Position: Sitting, Cuff Size: Normal)   Pulse 75   Ht 5\' 2"  (1.575 m)   Wt 114 lb (51.7 kg)   LMP 02/20/2024 (Exact Date)   SpO2 98%   BMI 20.85 kg/m  BP Readings from Last 3 Encounters:  03/01/24 96/61  02/26/24 104/66  01/16/24 105/70    Physical Exam Vitals reviewed.  Constitutional:      Appearance: Normal appearance.  Cardiovascular:     Rate and Rhythm: Normal rate and regular rhythm.     Pulses: Normal pulses.     Heart sounds: Normal heart sounds.  Pulmonary:     Effort: Pulmonary effort is normal.     Breath sounds: Normal breath sounds.  Neurological:     Mental Status: She is alert.  Psychiatric:        Mood and Affect: Mood normal.        Behavior: Behavior normal.     Assessment & Plan:   1. Primary insomnia  (Primary) Patient reports tolerating trazodone 50mg  nightly and reports being unable to sleep without it. She reports when she tried 25mg  nightly, she did not notice an improvement in her ability to fall asleep or stay asleep. She would like to continue taking trazodone 50mg  nightly. She also reports an improvement in her mood. PHQ9 score improved from score of 4 at previous visit to score of 2 today. Denies SI/HI. Patient in no acute distress and is well-appearing. Denies chest pain, shortness of breath, lower extremity edema, vision changes, headaches. Cardiovascular exam with heart regular rate and rhythm. Normal heart sounds, no murmurs present. No lower extremity edema present. Lungs clear to auscultation bilaterally. Rx sent to pharmacy on file. Will follow-up at upcoming physical (plan to schedule).  - traZODone (DESYREL) 50 MG tablet; Take 1 tablet (50 mg total) by mouth at bedtime.  Dispense: 90 tablet; Refill: 1   Return if symptoms worsen or fail to improve.    Alyson Reedy, FNP

## 2024-03-01 NOTE — Patient Instructions (Signed)

## 2024-03-03 ENCOUNTER — Encounter: Payer: Self-pay | Admitting: Family Medicine

## 2024-03-03 ENCOUNTER — Ambulatory Visit
Admission: RE | Admit: 2024-03-03 | Discharge: 2024-03-03 | Disposition: A | Discharge status: Home / Self Care | Source: Ambulatory Visit | Attending: Obstetrics and Gynecology | Admitting: Obstetrics and Gynecology

## 2024-03-03 DIAGNOSIS — R1031 Right lower quadrant pain: Secondary | ICD-10-CM | POA: Diagnosis present

## 2024-03-03 DIAGNOSIS — R102 Pelvic and perineal pain: Secondary | ICD-10-CM | POA: Diagnosis present

## 2024-03-03 DIAGNOSIS — G8929 Other chronic pain: Secondary | ICD-10-CM | POA: Diagnosis present

## 2024-03-10 ENCOUNTER — Encounter: Payer: Self-pay | Admitting: Obstetrics and Gynecology

## 2024-03-12 ENCOUNTER — Ambulatory Visit (INDEPENDENT_AMBULATORY_CARE_PROVIDER_SITE_OTHER): Admitting: Obstetrics and Gynecology

## 2024-03-12 ENCOUNTER — Encounter: Payer: Self-pay | Admitting: Obstetrics and Gynecology

## 2024-03-12 VITALS — BP 98/62 | HR 72 | Ht 62.0 in | Wt 115.5 lb

## 2024-03-12 DIAGNOSIS — R1031 Right lower quadrant pain: Secondary | ICD-10-CM | POA: Diagnosis not present

## 2024-03-12 DIAGNOSIS — N9489 Other specified conditions associated with female genital organs and menstrual cycle: Secondary | ICD-10-CM | POA: Diagnosis not present

## 2024-03-12 DIAGNOSIS — N921 Excessive and frequent menstruation with irregular cycle: Secondary | ICD-10-CM

## 2024-03-12 DIAGNOSIS — R102 Pelvic and perineal pain: Secondary | ICD-10-CM | POA: Diagnosis not present

## 2024-03-12 DIAGNOSIS — G8929 Other chronic pain: Secondary | ICD-10-CM

## 2024-03-12 NOTE — Progress Notes (Signed)
 HPI:      [name] is a 38 y.o. [id number] who LMP was Patient's last menstrual period was 02/20/2024 (exact date).  Subjective:   She presents today her pelvic pain, dyspareunia and dysmenorrhea.  She has previously been diagnosed with pelvic congestion syndrome and has had her vessels embolized without success.  She continues to have extremely heavy painful menses.  She has significant pain with intercourse to the extent that she regularly avoids sex.  She also states that her right ovary has caused her constant problems and she is considering right oophorectomy. Of significant note she has had a previous tubal ligation and has completed childbearing.    Hx: The following portions of the patient's history were reviewed and updated as appropriate:             She  has a past medical history of ADHD, Anxiety, Depression, and Family history of breast cancer. She does not have any pertinent problems on file. She  has a past surgical history that includes Tubal ligation and Other surgical history (08/14/2022). Her family history includes Breast cancer in some other family members; Diabetes in her father; Heart failure in her father; Mental illness in her mother. She  reports that she has never smoked. She has never been exposed to tobacco smoke. She has never used smokeless tobacco. She reports that she does not drink alcohol and does not use drugs. She has a current medication list which includes the following prescription(s): trazodone . She has no known allergies.       Review of Systems:  Review of Systems  Constitutional: Denied constitutional symptoms, night sweats, recent illness, fatigue, fever, insomnia and weight loss.  Eyes: Denied eye symptoms, eye pain, photophobia, vision change and visual disturbance.  Ears/Nose/Throat/Neck: Denied ear, nose, throat or neck symptoms, hearing loss, nasal discharge, sinus congestion and sore throat.  Cardiovascular: Denied cardiovascular  symptoms, arrhythmia, chest pain/pressure, edema, exercise intolerance, orthopnea and palpitations.  Respiratory: Denied pulmonary symptoms, asthma, pleuritic pain, productive sputum, cough, dyspnea and wheezing.  Gastrointestinal: Denied, gastro-esophageal reflux, melena, nausea and vomiting.  Genitourinary: See HPI for additional information.  Musculoskeletal: Denied musculoskeletal symptoms, stiffness, swelling, muscle weakness and myalgia.  Dermatologic: Denied dermatology symptoms, rash and scar.  Neurologic: Denied neurology symptoms, dizziness, headache, neck pain and syncope.  Psychiatric: Denied psychiatric symptoms, anxiety and depression.  Endocrine: Denied endocrine symptoms including hot flashes and night sweats.   Meds:   Current Outpatient Medications on File Prior to Visit  Medication Sig Dispense Refill   traZODone  (DESYREL ) 50 MG tablet Take 1 tablet (50 mg total) by mouth at bedtime. 90 tablet 1   No current facility-administered medications on file prior to visit.      Objective:     Vitals:   03/12/24 0940  BP: 98/62  Pulse: 72   Filed Weights   03/12/24 0940  Weight: 115 lb 8 oz (52.4 kg)                        Assessment:    [id number] Patient Active Problem List   Diagnosis Date Noted   Irregular periods/menstrual cycles 07/05/2022   Pelvic congestion syndrome 07/05/2022   History of ovarian cyst 04/29/2022   Anxiety and depression 06/19/2018     1. Pelvic pain   2. Menometrorrhagia   3. Pelvic congestion syndrome   4. Chronic RLQ pain     Continued pelvic pain dysmenorrhea dyspareunia  Based on history possible  endometriosis/adenomyosis   Plan:            1.  She desires definitive surgery.  Laparoscopic hysterectomy discussed.  Possible right oophorectomy.  All questions answered.  She is to consider the timeline and make a decision regarding her surgery. Orders No orders of the defined types were placed in this encounter.   No  orders of the defined types were placed in this encounter.     F/U  No follow-ups on file.  Delice Felt, M.D. 03/12/2024 10:18 AM

## 2024-03-12 NOTE — Progress Notes (Signed)
 Patient presents today to discuss a hysterectomy consult. She states a long history of heavy irregular cycles, pain with intercourse, pelvic congestion syndrome and a history of ovarian cysts and vein embolism. Reports she has no interest in medication options at this time.

## 2024-03-23 ENCOUNTER — Ambulatory Visit (INDEPENDENT_AMBULATORY_CARE_PROVIDER_SITE_OTHER): Admitting: Obstetrics and Gynecology

## 2024-03-23 ENCOUNTER — Encounter: Payer: Self-pay | Admitting: Obstetrics and Gynecology

## 2024-03-23 VITALS — BP 103/65 | HR 69 | Ht 62.0 in | Wt 117.2 lb

## 2024-03-23 DIAGNOSIS — G8929 Other chronic pain: Secondary | ICD-10-CM

## 2024-03-23 DIAGNOSIS — N921 Excessive and frequent menstruation with irregular cycle: Secondary | ICD-10-CM

## 2024-03-23 DIAGNOSIS — N941 Unspecified dyspareunia: Secondary | ICD-10-CM

## 2024-03-23 DIAGNOSIS — N9489 Other specified conditions associated with female genital organs and menstrual cycle: Secondary | ICD-10-CM

## 2024-03-23 DIAGNOSIS — Z01818 Encounter for other preprocedural examination: Secondary | ICD-10-CM | POA: Diagnosis not present

## 2024-03-23 DIAGNOSIS — R102 Pelvic and perineal pain: Secondary | ICD-10-CM

## 2024-03-23 NOTE — Progress Notes (Signed)
 PRE-OPERATIVE HISTORY AND PHYSICAL EXAM  PCP:  Wilhelmena Hanson, FNP Subjective:   HPI:  Nancy Walls is a 38 y.o. [id number].  Patient's last menstrual period was 02/20/2024 (exact date).  She presents today for a pre-op discussion and PE.  She has the following symptoms: Dysmenorrhea, dyspareunia, pelvic congestion syndrome (failed coils), history of right ovarian cysts.  Review of Systems:   Constitutional: Denied constitutional symptoms, night sweats, recent illness, fatigue, fever, insomnia and weight loss.  Eyes: Denied eye symptoms, eye pain, photophobia, vision change and visual disturbance.  Ears/Nose/Throat/Neck: Denied ear, nose, throat or neck symptoms, hearing loss, nasal discharge, sinus congestion and sore throat.  Cardiovascular: Denied cardiovascular symptoms, arrhythmia, chest pain/pressure, edema, exercise intolerance, orthopnea and palpitations.  Respiratory: Denied pulmonary symptoms, asthma, pleuritic pain, productive sputum, cough, dyspnea and wheezing.  Gastrointestinal: Denied, gastro-esophageal reflux, melena, nausea and vomiting.  Genitourinary: See HPI for additional information.  Musculoskeletal: Denied musculoskeletal symptoms, stiffness, swelling, muscle weakness and myalgia.  Dermatologic: Denied dermatology symptoms, rash and scar.  Neurologic: Denied neurology symptoms, dizziness, headache, neck pain and syncope.  Psychiatric: Denied psychiatric symptoms, anxiety and depression.  Endocrine: Denied endocrine symptoms including hot flashes and night sweats.   OB History  Gravida Para Term Preterm AB Living  2 2 2   2   SAB IAB Ectopic Multiple Live Births      2    # Outcome Date GA Lbr Len/2nd Weight Sex Type Anes PTL Lv  2 Term      Vag-Spont     1 Term      Vag-Spont       Past Medical History:  Diagnosis Date   ADHD    Anxiety    Depression    Family history of breast cancer    Paternal and Maternal side    Past Surgical History:   Procedure Laterality Date   OTHER SURGICAL HISTORY  08/14/2022   IR Embolization Venous   TUBAL LIGATION        SOCIAL HISTORY:  Social History   Tobacco Use  Smoking Status Never   Passive exposure: Never  Smokeless Tobacco Never   Social History   Substance and Sexual Activity  Alcohol Use Never    Social History   Substance and Sexual Activity  Drug Use Never    Family History  Problem Relation Age of Onset   Mental illness Mother    Diabetes Father    Heart failure Father    Breast cancer Other        67s   Breast cancer Other     ALLERGIES:  Patient has no known allergies.  MEDS:   Current Outpatient Medications on File Prior to Visit  Medication Sig Dispense Refill   traZODone  (DESYREL ) 50 MG tablet Take 1 tablet (50 mg total) by mouth at bedtime. 90 tablet 1   No current facility-administered medications on file prior to visit.    No orders of the defined types were placed in this encounter.    Physical examination BP 103/65   Pulse 69   Ht 5\' 2"  (1.575 m)   Wt 117 lb 3.2 oz (53.2 kg)   LMP 02/20/2024 (Exact Date)   BMI 21.44 kg/m   General NAD, Conversant  HEENT Atraumatic; Op clear with mmm.  Normo-cephalic.  Anicteric sclerae  Thyroid/Neck Smooth without nodularity or enlargement. Normal ROM.  Neck Supple.  Skin No rashes, lesions or ulceration. Normal palpated skin turgor. No nodularity.  Breasts: No masses or discharge.  Symmetric.  No axillary adenopathy.  Lungs: Clear to auscultation.No rales or wheezes. Normal Respiratory effort, no retractions.  Heart: NSR.  No murmurs or rubs appreciated. No peripheral edema  Abdomen: Soft.  Non-tender.  No masses.  No HSM. No hernia  Extremities: Moves all appropriately.  Normal ROM for age. No lymphadenopathy.  Neuro: Oriented to PPT.  Normal mood. Normal affect.     Pelvic:   Vulva: Normal appearance.  No lesions.  Vagina: No lesions or abnormalities noted.  Support: Normal pelvic  support.  Urethra No masses tenderness or scarring.  Meatus Normal size without lesions or prolapse.  Cervix: Normal ectropion.  No lesions.  Anus: Normal exam.  No lesions.  Perineum: Normal exam.  No lesions.        Bimanual   Uterus: Normal size.  Non-tender.  Mobile.  AV.  Adnexae: No masses.  Non-tender to palpation.  Cul-de-sac: Negative for abnormality.   Assessment:   G2P2002 Patient Active Problem List   Diagnosis Date Noted   Irregular periods/menstrual cycles 07/05/2022   Pelvic congestion syndrome 07/05/2022   History of ovarian cyst 04/29/2022   Anxiety and depression 06/19/2018    1. Pre-op exam   2. Pelvic pain   3. Menometrorrhagia   4. Pelvic congestion syndrome   5. Chronic RLQ pain   6. Dyspareunia, female      Plan:   Orders: No orders of the defined types were placed in this encounter.    1.  LAVH RSO left salpingectomy  Pre-op discussions regarding Risks and Benefits of her scheduled surgery.  LAVH The procedure of Laparoscopic Assisted Vaginal Hysterectomy was described to the patient in detail.  We reviewed the rationale for Hysterectomy and the patient was again informed of other nonsurgical management possibilities for her condition.  She has considered these other options, and desires a Hysterectomy.  We have reviewed the fact that Hysterectomy is permanent and that following the procedure she will not be able to become pregnant or bear children.  We have discussed the following risk factors specifically and the patient has also been informed that additional complications not mentioned may develop:  Damage to bowel, bladder, ureters or to other internal organs, bleeding, infection and the risk from anesthesia.  We have discussed the procedure itself in detail and she has an informed understanding of this surgery.  We have also discussed the recovery period in which physical and sexual activity will be restricted for a varying degree of time, often  3 - 6 weeks. The Laparoscopic Portion of Hysterectomy has also been reviewed with the patient.  She understands how the laparoscope facilitates the procedure.  We have discussed the abdominal incisions and punctures that will be used.  We have also reviewed the increased Operating Room time often accompanying LAVH.  The slightly increased risk of complications secondary to abdominal punctures, and use of laparoscopic instrumentation has also been discussed in detail.I have answered all of her questions and I believe the patient has an informed understanding of the procedure of Laparoscopic Assisted Vaginal Hysterectomy. Oophorectomy The option of Oophorectomy has been discussed with the patient.  Detailed risk/benefits have been reviewed.  The risks discussed include, but are not limited to, hemorrhage, infection, damage to ureter or other internal organ, and Ovarian Remnant Syndrome.  The benefits include a significant decrease in the risk of Ovarian Cancer and in benign Ovarian disease.  The risk of Ovarian CA has been estimated at 1  in 41.  This is a relatively small risk.  However, should Ovarian CA develop, it is often found late in the course of the disease.  We have also discussed the role of inheritance in the development of Ovarian disease.  Some women, who have close relatives with Ovarian CA, have a higher than 1 in 70 risk of Ovarian CA.  The benefits of Estrogen replacement therapy following Oophorectomy has been stressed.  If she is premenopausal, we have discussed the fact that this procedure will make her permanently sterile and that premature menopause will result if no ERT is begun.  I have answered all of her questions, and I believe that she has an adequate and informed understanding of the risks and benefits of Oophorectomy. She would like her right ovary removed because she has had problems with it in the past.  Delice Felt, M.D. 03/23/2024 2:00 PM

## 2024-03-23 NOTE — H&P (View-Only) (Signed)
 PRE-OPERATIVE HISTORY AND PHYSICAL EXAM  PCP:  Wilhelmena Hanson, FNP Subjective:   HPI:  Nancy Walls is a 38 y.o. [id number].  Patient's last menstrual period was 02/20/2024 (exact date).  She presents today for a pre-op discussion and PE.  She has the following symptoms: Dysmenorrhea, dyspareunia, pelvic congestion syndrome (failed coils), history of right ovarian cysts.  Review of Systems:   Constitutional: Denied constitutional symptoms, night sweats, recent illness, fatigue, fever, insomnia and weight loss.  Eyes: Denied eye symptoms, eye pain, photophobia, vision change and visual disturbance.  Ears/Nose/Throat/Neck: Denied ear, nose, throat or neck symptoms, hearing loss, nasal discharge, sinus congestion and sore throat.  Cardiovascular: Denied cardiovascular symptoms, arrhythmia, chest pain/pressure, edema, exercise intolerance, orthopnea and palpitations.  Respiratory: Denied pulmonary symptoms, asthma, pleuritic pain, productive sputum, cough, dyspnea and wheezing.  Gastrointestinal: Denied, gastro-esophageal reflux, melena, nausea and vomiting.  Genitourinary: See HPI for additional information.  Musculoskeletal: Denied musculoskeletal symptoms, stiffness, swelling, muscle weakness and myalgia.  Dermatologic: Denied dermatology symptoms, rash and scar.  Neurologic: Denied neurology symptoms, dizziness, headache, neck pain and syncope.  Psychiatric: Denied psychiatric symptoms, anxiety and depression.  Endocrine: Denied endocrine symptoms including hot flashes and night sweats.   OB History  Gravida Para Term Preterm AB Living  2 2 2   2   SAB IAB Ectopic Multiple Live Births      2    # Outcome Date GA Lbr Len/2nd Weight Sex Type Anes PTL Lv  2 Term      Vag-Spont     1 Term      Vag-Spont       Past Medical History:  Diagnosis Date   ADHD    Anxiety    Depression    Family history of breast cancer    Paternal and Maternal side    Past Surgical History:   Procedure Laterality Date   OTHER SURGICAL HISTORY  08/14/2022   IR Embolization Venous   TUBAL LIGATION        SOCIAL HISTORY:  Social History   Tobacco Use  Smoking Status Never   Passive exposure: Never  Smokeless Tobacco Never   Social History   Substance and Sexual Activity  Alcohol Use Never    Social History   Substance and Sexual Activity  Drug Use Never    Family History  Problem Relation Age of Onset   Mental illness Mother    Diabetes Father    Heart failure Father    Breast cancer Other        51s   Breast cancer Other     ALLERGIES:  Patient has no known allergies.  MEDS:   Current Outpatient Medications on File Prior to Visit  Medication Sig Dispense Refill   traZODone  (DESYREL ) 50 MG tablet Take 1 tablet (50 mg total) by mouth at bedtime. 90 tablet 1   No current facility-administered medications on file prior to visit.    No orders of the defined types were placed in this encounter.    Physical examination BP 103/65   Pulse 69   Ht 5\' 2"  (1.575 m)   Wt 117 lb 3.2 oz (53.2 kg)   LMP 02/20/2024 (Exact Date)   BMI 21.44 kg/m   General NAD, Conversant  HEENT Atraumatic; Op clear with mmm.  Normo-cephalic.  Anicteric sclerae  Thyroid/Neck Smooth without nodularity or enlargement. Normal ROM.  Neck Supple.  Skin No rashes, lesions or ulceration. Normal palpated skin turgor. No nodularity.  Breasts: No masses or discharge.  Symmetric.  No axillary adenopathy.  Lungs: Clear to auscultation.No rales or wheezes. Normal Respiratory effort, no retractions.  Heart: NSR.  No murmurs or rubs appreciated. No peripheral edema  Abdomen: Soft.  Non-tender.  No masses.  No HSM. No hernia  Extremities: Moves all appropriately.  Normal ROM for age. No lymphadenopathy.  Neuro: Oriented to PPT.  Normal mood. Normal affect.     Pelvic:   Vulva: Normal appearance.  No lesions.  Vagina: No lesions or abnormalities noted.  Support: Normal pelvic  support.  Urethra No masses tenderness or scarring.  Meatus Normal size without lesions or prolapse.  Cervix: Normal ectropion.  No lesions.  Anus: Normal exam.  No lesions.  Perineum: Normal exam.  No lesions.        Bimanual   Uterus: Normal size.  Non-tender.  Mobile.  AV.  Adnexae: No masses.  Non-tender to palpation.  Cul-de-sac: Negative for abnormality.   Assessment:   G2P2002 Patient Active Problem List   Diagnosis Date Noted   Irregular periods/menstrual cycles 07/05/2022   Pelvic congestion syndrome 07/05/2022   History of ovarian cyst 04/29/2022   Anxiety and depression 06/19/2018    1. Pre-op exam   2. Pelvic pain   3. Menometrorrhagia   4. Pelvic congestion syndrome   5. Chronic RLQ pain   6. Dyspareunia, female      Plan:   Orders: No orders of the defined types were placed in this encounter.    1.  LAVH RSO left salpingectomy   Delice Felt, M.D. 03/23/2024 2:00 PM

## 2024-03-23 NOTE — H&P (Signed)
 PRE-OPERATIVE HISTORY AND PHYSICAL EXAM  PCP:  Wilhelmena Hanson, FNP Subjective:   HPI:  Nancy Walls is a 38 y.o. [id number].  Patient's last menstrual period was 02/20/2024 (exact date).  She presents today for a pre-op discussion and PE.  She has the following symptoms: Dysmenorrhea, dyspareunia, pelvic congestion syndrome (failed coils), history of right ovarian cysts.  Review of Systems:   Constitutional: Denied constitutional symptoms, night sweats, recent illness, fatigue, fever, insomnia and weight loss.  Eyes: Denied eye symptoms, eye pain, photophobia, vision change and visual disturbance.  Ears/Nose/Throat/Neck: Denied ear, nose, throat or neck symptoms, hearing loss, nasal discharge, sinus congestion and sore throat.  Cardiovascular: Denied cardiovascular symptoms, arrhythmia, chest pain/pressure, edema, exercise intolerance, orthopnea and palpitations.  Respiratory: Denied pulmonary symptoms, asthma, pleuritic pain, productive sputum, cough, dyspnea and wheezing.  Gastrointestinal: Denied, gastro-esophageal reflux, melena, nausea and vomiting.  Genitourinary: See HPI for additional information.  Musculoskeletal: Denied musculoskeletal symptoms, stiffness, swelling, muscle weakness and myalgia.  Dermatologic: Denied dermatology symptoms, rash and scar.  Neurologic: Denied neurology symptoms, dizziness, headache, neck pain and syncope.  Psychiatric: Denied psychiatric symptoms, anxiety and depression.  Endocrine: Denied endocrine symptoms including hot flashes and night sweats.   OB History  Gravida Para Term Preterm AB Living  2 2 2   2   SAB IAB Ectopic Multiple Live Births      2    # Outcome Date GA Lbr Len/2nd Weight Sex Type Anes PTL Lv  2 Term      Vag-Spont     1 Term      Vag-Spont       Past Medical History:  Diagnosis Date   ADHD    Anxiety    Depression    Family history of breast cancer    Paternal and Maternal side    Past Surgical History:   Procedure Laterality Date   OTHER SURGICAL HISTORY  08/14/2022   IR Embolization Venous   TUBAL LIGATION        SOCIAL HISTORY:  Social History   Tobacco Use  Smoking Status Never   Passive exposure: Never  Smokeless Tobacco Never   Social History   Substance and Sexual Activity  Alcohol Use Never    Social History   Substance and Sexual Activity  Drug Use Never    Family History  Problem Relation Age of Onset   Mental illness Mother    Diabetes Father    Heart failure Father    Breast cancer Other        51s   Breast cancer Other     ALLERGIES:  Patient has no known allergies.  MEDS:   Current Outpatient Medications on File Prior to Visit  Medication Sig Dispense Refill   traZODone  (DESYREL ) 50 MG tablet Take 1 tablet (50 mg total) by mouth at bedtime. 90 tablet 1   No current facility-administered medications on file prior to visit.    No orders of the defined types were placed in this encounter.    Physical examination BP 103/65   Pulse 69   Ht 5\' 2"  (1.575 m)   Wt 117 lb 3.2 oz (53.2 kg)   LMP 02/20/2024 (Exact Date)   BMI 21.44 kg/m   General NAD, Conversant  HEENT Atraumatic; Op clear with mmm.  Normo-cephalic.  Anicteric sclerae  Thyroid/Neck Smooth without nodularity or enlargement. Normal ROM.  Neck Supple.  Skin No rashes, lesions or ulceration. Normal palpated skin turgor. No nodularity.  Breasts: No masses or discharge.  Symmetric.  No axillary adenopathy.  Lungs: Clear to auscultation.No rales or wheezes. Normal Respiratory effort, no retractions.  Heart: NSR.  No murmurs or rubs appreciated. No peripheral edema  Abdomen: Soft.  Non-tender.  No masses.  No HSM. No hernia  Extremities: Moves all appropriately.  Normal ROM for age. No lymphadenopathy.  Neuro: Oriented to PPT.  Normal mood. Normal affect.     Pelvic:   Vulva: Normal appearance.  No lesions.  Vagina: No lesions or abnormalities noted.  Support: Normal pelvic  support.  Urethra No masses tenderness or scarring.  Meatus Normal size without lesions or prolapse.  Cervix: Normal ectropion.  No lesions.  Anus: Normal exam.  No lesions.  Perineum: Normal exam.  No lesions.        Bimanual   Uterus: Normal size.  Non-tender.  Mobile.  AV.  Adnexae: No masses.  Non-tender to palpation.  Cul-de-sac: Negative for abnormality.   Assessment:   G2P2002 Patient Active Problem List   Diagnosis Date Noted   Irregular periods/menstrual cycles 07/05/2022   Pelvic congestion syndrome 07/05/2022   History of ovarian cyst 04/29/2022   Anxiety and depression 06/19/2018    1. Pre-op exam   2. Pelvic pain   3. Menometrorrhagia   4. Pelvic congestion syndrome   5. Chronic RLQ pain   6. Dyspareunia, female      Plan:   Orders: No orders of the defined types were placed in this encounter.    1.  LAVH RSO left salpingectomy   Delice Felt, M.D. 03/23/2024 2:00 PM

## 2024-03-23 NOTE — Progress Notes (Signed)
 Patient presents today for a pre-op exam prior to LAVH and right oophorectomy. She states no additional concerns today.

## 2024-04-05 DIAGNOSIS — Z0289 Encounter for other administrative examinations: Secondary | ICD-10-CM

## 2024-04-13 ENCOUNTER — Encounter
Admission: RE | Admit: 2024-04-13 | Discharge: 2024-04-13 | Disposition: A | Discharge status: Home / Self Care | Source: Ambulatory Visit | Attending: Obstetrics and Gynecology | Admitting: Obstetrics and Gynecology

## 2024-04-13 ENCOUNTER — Other Ambulatory Visit: Payer: Self-pay

## 2024-04-13 ENCOUNTER — Encounter: Payer: Self-pay | Admitting: Obstetrics and Gynecology

## 2024-04-13 VITALS — Ht 62.0 in | Wt 117.0 lb

## 2024-04-13 DIAGNOSIS — Z01818 Encounter for other preprocedural examination: Secondary | ICD-10-CM

## 2024-04-13 DIAGNOSIS — D649 Anemia, unspecified: Secondary | ICD-10-CM

## 2024-04-13 DIAGNOSIS — N939 Abnormal uterine and vaginal bleeding, unspecified: Secondary | ICD-10-CM

## 2024-04-13 DIAGNOSIS — Z01812 Encounter for preprocedural laboratory examination: Secondary | ICD-10-CM

## 2024-04-13 HISTORY — DX: Other specified conditions associated with female genital organs and menstrual cycle: N94.89

## 2024-04-13 HISTORY — DX: Dysmenorrhea, unspecified: N94.6

## 2024-04-13 HISTORY — DX: Personal history of other diseases of the female genital tract: Z87.42

## 2024-04-13 HISTORY — DX: Unspecified dyspareunia: N94.10

## 2024-04-13 HISTORY — DX: Gastro-esophageal reflux disease without esophagitis: K21.9

## 2024-04-13 MED ORDER — METRONIDAZOLE 500 MG PO TABS: 500.0000 mg | ORAL_TABLET | Freq: Two times a day (BID) | ORAL | 0 refills | Status: DC

## 2024-04-13 NOTE — Patient Instructions (Addendum)
 Your procedure is scheduled on:04-19-24 Monday Report to the Registration Desk on the 1st floor of the [facility].Then proceed to the 2nd floor Surgery Desk To find out your arrival time, please call [phone] between 1PM - 3PM on:04-16-24 Friday If your arrival time is 6:00 am, do not arrive before that time as the [facility] entrance doors do not open until 6:00 am.  REMEMBER: Instructions that are not followed completely may result in serious medical risk, up to and including death; or upon the discretion of your surgeon and anesthesiologist your surgery may need to be rescheduled.  Do not eat food OR drink any liquids after midnight the night before surgery.  No gum chewing or hard candies.  One week prior to surgery:Stop NOW (04-13-24) Stop Anti-inflammatories (NSAIDS) such as Advil, Aleve, Ibuprofen, Motrin, Naproxen, Naprosyn and Aspirin based products such as Excedrin, Goody's Powder, BC Powder. Stop ANY OVER THE COUNTER supplements until after surgery.  You may however, continue to take Tylenol if needed for pain up until the day of surgery.  Continue taking all of your other prescription medications up until the day of surgery.  Do NOT take any medication the day of surgery  No Alcohol for 24 hours before or after surgery.  No Smoking including e-cigarettes for 24 hours before surgery.  No chewable tobacco products for at least 6 hours before surgery.  No nicotine patches on the day of surgery.  Do not use any "recreational" drugs for at least a week (preferably 2 weeks) before your surgery.  Please be advised that the combination of cocaine and anesthesia may have negative outcomes, up to and including death. If you test positive for cocaine, your surgery will be cancelled.  On the morning of surgery brush your teeth with toothpaste and water, you may rinse your mouth with mouthwash if you wish. Do not swallow any toothpaste or mouthwash.  Use CHG Soap as directed  on instruction sheet.  Do not wear jewelry, make-up, hairpins, clips or nail polish.  For welded (permanent) jewelry: bracelets, anklets, waist bands, etc.  Please have this removed prior to surgery.  If it is not removed, there is a chance that hospital personnel will need to cut it off on the day of surgery.  Do not wear lotions, powders, or perfumes.   Do not shave body hair from the neck down 48 hours before surgery.  Contact lenses, hearing aids and dentures may not be worn into surgery.  Do not bring valuables to the hospital. [facility] is not responsible for any missing/lost belongings or valuables.   Notify your doctor if there is any change in your medical condition (cold, fever, infection).  Wear comfortable clothing (specific to your surgery type) to the hospital.  After surgery, you can help prevent lung complications by doing breathing exercises.  Take deep breaths and cough every 1-2 hours. Your doctor may order a device called an Incentive Spirometer to help you take deep breaths. When coughing or sneezing, hold a pillow firmly against your incision with both hands. This is called "splinting." Doing this helps protect your incision. It also decreases belly discomfort.  If you are being admitted to the hospital overnight, leave your suitcase in the car. After surgery it may be brought to your room.  In case of increased patient census, it may be necessary for you, the patient, to continue your postoperative care in the Same Day Surgery department.  If you are being discharged the day of  surgery, you will not be allowed to drive home. You will need a responsible individual to drive you home and stay with you for 24 hours after surgery.   If you are taking public transportation, you will need to have a responsible individual with you.  Please call the Pre-admissions Testing Dept. at [phone] if you have any questions about these instructions.  Surgery Visitation  Policy:  Patients having surgery or a procedure may have two visitors.  Children under the age of 59 must have an adult with them who is not the patient.     Preparing for Surgery with CHLORHEXIDINE GLUCONATE (CHG) Soap  Chlorhexidine Gluconate (CHG) Soap  o An antiseptic cleaner that kills germs and bonds with the skin to continue killing germs even after washing  o Used for showering the night before surgery and morning of surgery  Before surgery, you can play an important role by reducing the number of germs on your skin.  CHG (Chlorhexidine gluconate) soap is an antiseptic cleanser which kills germs and bonds with the skin to continue killing germs even after washing.  Please do not use if you have an allergy to CHG or antibacterial soaps. If your skin becomes reddened/irritated stop using the CHG.  1. Shower the NIGHT BEFORE SURGERY and the MORNING OF SURGERY with CHG soap.  2. If you choose to wash your hair, wash your hair first as usual with your normal shampoo.  3. After shampooing, rinse your hair and body thoroughly to remove the shampoo.  4. Use CHG as you would any other liquid soap. You can apply CHG directly to the skin and wash gently with a scrungie or a clean washcloth.  5. Apply the CHG soap to your body only from the neck down. Do not use on open wounds or open sores. Avoid contact with your eyes, ears, mouth, and genitals (private parts). Wash face and genitals (private parts) with your normal soap.  6. Wash thoroughly, paying special attention to the area where your surgery will be performed.  7. Thoroughly rinse your body with warm water.  8. Do not shower/wash with your normal soap after using and rinsing off the CHG soap.  9. Pat yourself dry with a clean towel.  10. Wear clean pajamas to bed the night before surgery.  12. Place clean sheets on your bed the night of your first shower and do not sleep with pets.  13. Shower again with the CHG soap on  the day of surgery prior to arriving at the hospital.  14. Do not apply any deodorants/lotions/powders.  15. Please wear clean clothes to the hospital.

## 2024-04-14 ENCOUNTER — Encounter
Admission: RE | Admit: 2024-04-14 | Discharge: 2024-04-14 | Disposition: A | Discharge status: Home / Self Care | Source: Ambulatory Visit | Attending: Obstetrics and Gynecology | Admitting: Obstetrics and Gynecology

## 2024-04-14 DIAGNOSIS — Z01812 Encounter for preprocedural laboratory examination: Secondary | ICD-10-CM | POA: Insufficient documentation

## 2024-04-14 DIAGNOSIS — N939 Abnormal uterine and vaginal bleeding, unspecified: Secondary | ICD-10-CM | POA: Diagnosis not present

## 2024-04-14 DIAGNOSIS — Z01818 Encounter for other preprocedural examination: Secondary | ICD-10-CM

## 2024-04-14 LAB — CBC
HCT: 40.2 % (ref 36.0–46.0)
Hemoglobin: 13.1 g/dL (ref 12.0–15.0)
MCH: 30 pg (ref 26.0–34.0)
MCHC: 32.6 g/dL (ref 30.0–36.0)
MCV: 92.2 fL (ref 80.0–100.0)
Platelets: 215 10*3/uL (ref 150–400)
RBC: 4.36 MIL/uL (ref 3.87–5.11)
RDW: 12.1 % (ref 11.5–15.5)
WBC: 4.9 10*3/uL (ref 4.0–10.5)
nRBC: 0 % (ref 0.0–0.2)

## 2024-04-14 LAB — TYPE AND SCREEN
ABO/RH(D): A POS
Antibody Screen: NEGATIVE

## 2024-04-19 ENCOUNTER — Ambulatory Visit
Admission: RE | Admit: 2024-04-19 | Discharge: 2024-04-19 | Disposition: A | Discharge status: Home / Self Care | Attending: Obstetrics and Gynecology | Admitting: Obstetrics and Gynecology

## 2024-04-19 ENCOUNTER — Encounter
Admission: RE | Disposition: A | Discharge status: Home / Self Care | Payer: Self-pay | Source: Home / Self Care | Attending: Obstetrics and Gynecology

## 2024-04-19 ENCOUNTER — Other Ambulatory Visit: Payer: Self-pay

## 2024-04-19 ENCOUNTER — Ambulatory Visit: Payer: Self-pay | Admitting: Urgent Care

## 2024-04-19 ENCOUNTER — Ambulatory Visit: Admitting: Anesthesiology

## 2024-04-19 ENCOUNTER — Encounter: Payer: Self-pay | Admitting: Obstetrics and Gynecology

## 2024-04-19 DIAGNOSIS — N9489 Other specified conditions associated with female genital organs and menstrual cycle: Secondary | ICD-10-CM | POA: Diagnosis present

## 2024-04-19 DIAGNOSIS — F419 Anxiety disorder, unspecified: Secondary | ICD-10-CM | POA: Insufficient documentation

## 2024-04-19 DIAGNOSIS — R102 Pelvic and perineal pain: Secondary | ICD-10-CM | POA: Insufficient documentation

## 2024-04-19 DIAGNOSIS — R1031 Right lower quadrant pain: Secondary | ICD-10-CM | POA: Diagnosis not present

## 2024-04-19 DIAGNOSIS — D259 Leiomyoma of uterus, unspecified: Secondary | ICD-10-CM

## 2024-04-19 DIAGNOSIS — N941 Unspecified dyspareunia: Secondary | ICD-10-CM | POA: Diagnosis not present

## 2024-04-19 DIAGNOSIS — G8929 Other chronic pain: Secondary | ICD-10-CM | POA: Insufficient documentation

## 2024-04-19 DIAGNOSIS — Z01818 Encounter for other preprocedural examination: Secondary | ICD-10-CM

## 2024-04-19 DIAGNOSIS — N83201 Unspecified ovarian cyst, right side: Secondary | ICD-10-CM

## 2024-04-19 DIAGNOSIS — K219 Gastro-esophageal reflux disease without esophagitis: Secondary | ICD-10-CM | POA: Insufficient documentation

## 2024-04-19 DIAGNOSIS — N921 Excessive and frequent menstruation with irregular cycle: Secondary | ICD-10-CM | POA: Insufficient documentation

## 2024-04-19 DIAGNOSIS — N946 Dysmenorrhea, unspecified: Secondary | ICD-10-CM | POA: Insufficient documentation

## 2024-04-19 HISTORY — PX: LAPAROSCOPIC VAGINAL HYSTERECTOMY WITH SALPINGO OOPHORECTOMY: SHX6681

## 2024-04-19 LAB — POCT PREGNANCY, URINE: Preg Test, Ur: NEGATIVE

## 2024-04-19 LAB — ABO/RH: ABO/RH(D): A POS

## 2024-04-19 SURGERY — HYSTERECTOMY, VAGINAL, LAPAROSCOPY-ASSISTED, WITH SALPINGO-OOPHORECTOMY
Anesthesia: General | Site: Abdomen

## 2024-04-19 MED ORDER — FENTANYL CITRATE (PF) 100 MCG/2ML IJ SOLN
INTRAMUSCULAR | Status: AC
  Filled 2024-04-19: qty 2

## 2024-04-19 MED ORDER — BUPIVACAINE HCL 0.5 % IJ SOLN
INTRAMUSCULAR | Status: DC | PRN
  Administered 2024-04-19: 15 mL

## 2024-04-19 MED ORDER — MIDAZOLAM HCL 2 MG/2ML IJ SOLN
INTRAMUSCULAR | Status: AC
  Filled 2024-04-19: qty 2

## 2024-04-19 MED ORDER — PHENYLEPHRINE 80 MCG/ML (10ML) SYRINGE FOR IV PUSH (FOR BLOOD PRESSURE SUPPORT)
PREFILLED_SYRINGE | INTRAVENOUS | Status: DC | PRN
  Administered 2024-04-19: 160 ug via INTRAVENOUS
  Administered 2024-04-19: 80 ug via INTRAVENOUS
  Administered 2024-04-19: 160 ug via INTRAVENOUS

## 2024-04-19 MED ORDER — ROCURONIUM BROMIDE 100 MG/10ML IV SOLN
INTRAVENOUS | Status: DC | PRN
  Administered 2024-04-19: 50 mg via INTRAVENOUS

## 2024-04-19 MED ORDER — LIDOCAINE HCL (CARDIAC) PF 100 MG/5ML IV SOSY
PREFILLED_SYRINGE | INTRAVENOUS | Status: DC | PRN
  Administered 2024-04-19: 80 mg via INTRAVENOUS

## 2024-04-19 MED ORDER — PROPOFOL 10 MG/ML IV BOLUS
INTRAVENOUS | Status: DC | PRN
  Administered 2024-04-19: 200 mg via INTRAVENOUS

## 2024-04-19 MED ORDER — LACTATED RINGERS IV SOLN: INTRAVENOUS | Status: DC

## 2024-04-19 MED ORDER — CHLORHEXIDINE GLUCONATE 0.12 % MT SOLN
15.0000 mL | Freq: Once | OROMUCOSAL | Status: AC
  Administered 2024-04-19: 15 mL via OROMUCOSAL

## 2024-04-19 MED ORDER — SILVER NITRATE-POT NITRATE 75-25 % EX MISC
CUTANEOUS | Status: AC
Start: 2024-04-19 — End: ?
  Filled 2024-04-19: qty 10

## 2024-04-19 MED ORDER — GABAPENTIN 300 MG PO CAPS
300.0000 mg | ORAL_CAPSULE | ORAL | Status: AC
  Administered 2024-04-19: 300 mg via ORAL

## 2024-04-19 MED ORDER — CEFAZOLIN SODIUM-DEXTROSE 2-4 GM/100ML-% IV SOLN
INTRAVENOUS | Status: AC
  Filled 2024-04-19: qty 100

## 2024-04-19 MED ORDER — ONDANSETRON HCL 4 MG/2ML IJ SOLN
INTRAMUSCULAR | Status: DC | PRN
  Administered 2024-04-19 (×2): 4 mg via INTRAVENOUS

## 2024-04-19 MED ORDER — ACETAMINOPHEN 500 MG PO TABS
ORAL_TABLET | ORAL | Status: AC
  Filled 2024-04-19: qty 2

## 2024-04-19 MED ORDER — DEXAMETHASONE SODIUM PHOSPHATE 10 MG/ML IJ SOLN
INTRAMUSCULAR | Status: DC | PRN
  Administered 2024-04-19: 10 mg via INTRAVENOUS

## 2024-04-19 MED ORDER — PROPOFOL 1000 MG/100ML IV EMUL
INTRAVENOUS | Status: AC
  Filled 2024-04-19: qty 100

## 2024-04-19 MED ORDER — SUGAMMADEX SODIUM 200 MG/2ML IV SOLN
INTRAVENOUS | Status: DC | PRN
  Administered 2024-04-19: 100 mg via INTRAVENOUS

## 2024-04-19 MED ORDER — GLYCOPYRROLATE 0.2 MG/ML IJ SOLN
INTRAMUSCULAR | Status: DC | PRN
Start: 2024-04-19 — End: 2024-04-19
  Administered 2024-04-19: .2 mg via INTRAVENOUS

## 2024-04-19 MED ORDER — FENTANYL CITRATE (PF) 100 MCG/2ML IJ SOLN
25.0000 ug | INTRAMUSCULAR | Status: DC | PRN
  Administered 2024-04-19 (×2): 25 ug via INTRAVENOUS

## 2024-04-19 MED ORDER — VASOPRESSIN 20 UNIT/ML IV SOLN
INTRAVENOUS | Status: AC
  Filled 2024-04-19: qty 1

## 2024-04-19 MED ORDER — FENTANYL CITRATE (PF) 100 MCG/2ML IJ SOLN
INTRAMUSCULAR | Status: DC | PRN
  Administered 2024-04-19: 50 ug via INTRAVENOUS

## 2024-04-19 MED ORDER — DEXMEDETOMIDINE HCL IN NACL 200 MCG/50ML IV SOLN
INTRAVENOUS | Status: DC | PRN
  Administered 2024-04-19: 12 ug via INTRAVENOUS

## 2024-04-19 MED ORDER — CELECOXIB 200 MG PO CAPS
400.0000 mg | ORAL_CAPSULE | ORAL | Status: AC
  Administered 2024-04-19: 400 mg via ORAL

## 2024-04-19 MED ORDER — ACETAMINOPHEN 10 MG/ML IV SOLN: 1000.0000 mg | Freq: Once | INTRAVENOUS | Status: DC | PRN

## 2024-04-19 MED ORDER — EPHEDRINE SULFATE-NACL 50-0.9 MG/10ML-% IV SOSY
PREFILLED_SYRINGE | INTRAVENOUS | Status: DC | PRN
  Administered 2024-04-19 (×2): 5 mg via INTRAVENOUS

## 2024-04-19 MED ORDER — ACETAMINOPHEN 500 MG PO TABS
1000.0000 mg | ORAL_TABLET | ORAL | Status: AC
  Administered 2024-04-19: 1000 mg via ORAL

## 2024-04-19 MED ORDER — MIDAZOLAM HCL 2 MG/2ML IJ SOLN
INTRAMUSCULAR | Status: DC | PRN
  Administered 2024-04-19: 2 mg via INTRAVENOUS

## 2024-04-19 MED ORDER — CELECOXIB 200 MG PO CAPS
ORAL_CAPSULE | ORAL | Status: AC
  Filled 2024-04-19: qty 2

## 2024-04-19 MED ORDER — ORAL CARE MOUTH RINSE: 15.0000 mL | Freq: Once | OROMUCOSAL | Status: AC

## 2024-04-19 MED ORDER — CHLORHEXIDINE GLUCONATE 0.12 % MT SOLN
OROMUCOSAL | Status: AC
Start: 2024-04-19 — End: ?
  Filled 2024-04-19: qty 15

## 2024-04-19 MED ORDER — BUPIVACAINE HCL (PF) 0.5 % IJ SOLN
INTRAMUSCULAR | Status: AC
  Filled 2024-04-19: qty 30

## 2024-04-19 MED ORDER — GABAPENTIN 300 MG PO CAPS
ORAL_CAPSULE | ORAL | Status: AC
Start: 2024-04-19 — End: ?
  Filled 2024-04-19: qty 1

## 2024-04-19 MED ORDER — 0.9 % SODIUM CHLORIDE (POUR BTL) OPTIME
TOPICAL | Status: DC | PRN
  Administered 2024-04-19: 1000 mL

## 2024-04-19 MED ORDER — HYDROMORPHONE HCL 1 MG/ML IJ SOLN
INTRAMUSCULAR | Status: AC
  Filled 2024-04-19: qty 1

## 2024-04-19 MED ORDER — CEFAZOLIN SODIUM-DEXTROSE 2-4 GM/100ML-% IV SOLN
2.0000 g | INTRAVENOUS | Status: AC
  Administered 2024-04-19: 2 g via INTRAVENOUS

## 2024-04-19 MED ORDER — SODIUM CHLORIDE (PF) 0.9 % IJ SOLN
INTRAMUSCULAR | Status: AC
  Filled 2024-04-19: qty 50

## 2024-04-19 MED ORDER — HYDROMORPHONE HCL 1 MG/ML IJ SOLN
INTRAMUSCULAR | Status: DC | PRN
  Administered 2024-04-19: 1 mg via INTRAVENOUS

## 2024-04-19 MED ORDER — OXYCODONE HCL 5 MG PO TABS
ORAL_TABLET | ORAL | Status: AC
Start: 2024-04-19 — End: ?
  Filled 2024-04-19: qty 1

## 2024-04-19 MED ORDER — HYDROCODONE-ACETAMINOPHEN 5-325 MG PO TABS: 1.0000 | ORAL_TABLET | Freq: Four times a day (QID) | ORAL | 0 refills | Status: DC | PRN

## 2024-04-19 MED ORDER — OXYCODONE HCL 5 MG PO TABS
5.0000 mg | ORAL_TABLET | Freq: Once | ORAL | Status: AC | PRN
  Administered 2024-04-19: 5 mg via ORAL

## 2024-04-19 MED ORDER — VASOPRESSIN 20 UNIT/ML IV SOLN
INTRAVENOUS | Status: DC | PRN
  Administered 2024-04-19: 12 mL via INTRAMUSCULAR

## 2024-04-19 MED ORDER — POVIDONE-IODINE 10 % EX SWAB: 2.0000 | Freq: Once | CUTANEOUS | Status: DC

## 2024-04-19 MED ORDER — OXYCODONE HCL 5 MG/5ML PO SOLN: 5.0000 mg | Freq: Once | ORAL | Status: AC | PRN

## 2024-04-19 MED ORDER — DROPERIDOL 2.5 MG/ML IJ SOLN: 0.6250 mg | Freq: Once | INTRAMUSCULAR | Status: DC | PRN

## 2024-04-19 SURGICAL SUPPLY — 43 items
ADHESIVE MASTISOL STRL (MISCELLANEOUS) IMPLANT
BAG URINE DRAIN 2000ML AR STRL (UROLOGICAL SUPPLIES) ×2 IMPLANT
BLADE SURG SZ11 CARB STEEL (BLADE) ×2 IMPLANT
CATH URTH 16FR FL 2W BLN LF (CATHETERS) ×2 IMPLANT
CHLORAPREP W/TINT 26 (MISCELLANEOUS) ×2 IMPLANT
DERMABOND ADVANCED .7 DNX12 (GAUZE/BANDAGES/DRESSINGS) ×2 IMPLANT
ELECTRODE REM PT RTRN 9FT ADLT (ELECTROSURGICAL) ×2 IMPLANT
GAUZE 4X4 16PLY ~~LOC~~+RFID DBL (SPONGE) ×2 IMPLANT
GLOVE PI ORTHO PRO STRL 7.5 (GLOVE) ×4 IMPLANT
GOWN STRL REUS W/ TWL LRG LVL3 (GOWN DISPOSABLE) ×2 IMPLANT
GOWN STRL REUS W/ TWL XL LVL3 (GOWN DISPOSABLE) ×4 IMPLANT
HANDLE YANKAUER SUCT BULB TIP (MISCELLANEOUS) IMPLANT
IRRIGATION STRYKERFLOW (MISCELLANEOUS) IMPLANT
IV LACTATED RINGERS 1000ML (IV SOLUTION) ×2 IMPLANT
KIT PINK PAD W/HEAD ARE REST (MISCELLANEOUS) ×1 IMPLANT
KIT PINK PAD W/HEAD ARM REST (MISCELLANEOUS) ×2 IMPLANT
KIT TURNOVER CYSTO (KITS) ×2 IMPLANT
LIGASURE LAP MARYLAND 5MM 37CM (ELECTROSURGICAL) IMPLANT
MANIFOLD NEPTUNE II (INSTRUMENTS) ×2 IMPLANT
NDL SPNL 22GX3.5 QUINCKE BK (NEEDLE) ×2 IMPLANT
NEEDLE SPNL 22GX3.5 QUINCKE BK (NEEDLE) ×1 IMPLANT
NS IRRIG 1000ML POUR BTL (IV SOLUTION) IMPLANT
PACK BASIN MINOR ARMC (MISCELLANEOUS) ×2 IMPLANT
PACK GYN LAPAROSCOPIC (MISCELLANEOUS) ×2 IMPLANT
PAD OB MATERNITY 11 LF (PERSONAL CARE ITEMS) ×2 IMPLANT
PAD PREP OB/GYN DISP 24X41 (PERSONAL CARE ITEMS) ×2 IMPLANT
PENCIL SMOKE EVACUATOR (MISCELLANEOUS) IMPLANT
RETRACTOR PHONTONGUIDE ADAPT (ADAPTER) IMPLANT
SCRUB CHG 4% DYNA-HEX 4OZ (MISCELLANEOUS) ×2 IMPLANT
SLEEVE Z-THREAD 5X100MM (TROCAR) ×4 IMPLANT
SOLUTION ELECTROSURG ANTI STCK (MISCELLANEOUS) IMPLANT
SPONGE T-LAP 18X18 ~~LOC~~+RFID (SPONGE) IMPLANT
STRIP CLOSURE SKIN 1/2X4 (GAUZE/BANDAGES/DRESSINGS) IMPLANT
SUT VIC AB 0 CT1 27XCR 8 STRN (SUTURE) ×4 IMPLANT
SUT VIC AB 0 CT1 36 (SUTURE) ×2 IMPLANT
SUT VIC AB 4-0 FS2 27 (SUTURE) ×2 IMPLANT
SYR 10ML LL (SYRINGE) ×2 IMPLANT
SYR CONTROL 10ML LL (SYRINGE) ×2 IMPLANT
TAPE TRANSPORE STRL 2 31045 (GAUZE/BANDAGES/DRESSINGS) ×2 IMPLANT
TRAP FLUID SMOKE EVACUATOR (MISCELLANEOUS) ×2 IMPLANT
TROCAR Z-THREAD FIOS 5X100MM (TROCAR) ×2 IMPLANT
TUBING EVAC SMOKE HEATED PNEUM (TUBING) ×2 IMPLANT
WATER STERILE IRR 500ML POUR (IV SOLUTION) ×2 IMPLANT

## 2024-04-19 NOTE — Interval H&P Note (Signed)
 History and Physical Interval Note:  04/19/2024 7:22 AM  Nancy Walls  has presented today for surgery, with the diagnosis of Pelvic pain dysmenorrhea dyspareunia pelvic congestion syndrome.  The various methods of treatment have been discussed with the patient and family. After consideration of risks, benefits and other options for treatment, the patient has consented to  Procedure(s) with comments: HYSTERECTOMY, VAGINAL, LAPAROSCOPY-ASSISTED, WITH SALPINGO-OOPHORECTOMY (Right) - LAVH RSO LEFT SALPINGECTOMY as a surgical intervention.  The patient's history has been reviewed, patient examined, no change in status, stable for surgery.  I have reviewed the patient's chart and labs.  Questions were answered to the patient's satisfaction.     Billy Bue

## 2024-04-19 NOTE — Anesthesia Preprocedure Evaluation (Addendum)
 Anesthesia Evaluation  Patient identified by MRN, date of birth, ID band Patient awake    Reviewed: Allergy & Precautions, H&P , NPO status , Patient's Chart, lab work & pertinent test results  Airway Mallampati: II  TM Distance: >3 FB Neck ROM: full    Dental no notable dental hx.    Pulmonary neg pulmonary ROS   Pulmonary exam normal        Cardiovascular negative cardio ROS Normal cardiovascular exam     Neuro/Psych  PSYCHIATRIC DISORDERS Anxiety     negative neurological ROS     GI/Hepatic Neg liver ROS,GERD  ,,  Endo/Other  negative endocrine ROS    Renal/GU      Musculoskeletal   Abdominal Normal abdominal exam  (+)   Peds  Hematology negative hematology ROS (+)   Anesthesia Other Findings Past Medical History: No date: ADHD No date: Anxiety No date: Depression No date: Dysmenorrhea No date: Dyspareunia in female No date: Family history of breast cancer     Comment:  Paternal and Maternal side No date: GERD (gastroesophageal reflux disease) No date: History of ovarian cyst No date: Pelvic congestion syndrome  Past Surgical History: 08/14/2022: OTHER SURGICAL HISTORY     Comment:  IR Embolization Venous No date: TUBAL LIGATION  BMI    Body Mass Index: 21.40 kg/m      Reproductive/Obstetrics negative OB ROS                             Anesthesia Physical Anesthesia Plan  ASA: 2  Anesthesia Plan: General ETT   Post-op Pain Management: Tylenol PO (pre-op)*, Celebrex PO (pre-op)* and Gabapentin PO (pre-op)*   Induction: Intravenous  PONV Risk Score and Plan: 2 and Ondansetron , Dexamethasone and Midazolam  Airway Management Planned: Oral ETT  Additional Equipment:   Intra-op Plan:   Post-operative Plan: Extubation in OR  Informed Consent: I have reviewed the patients History and Physical, chart, labs and discussed the procedure including the risks, benefits  and alternatives for the proposed anesthesia with the patient or authorized representative who has indicated his/her understanding and acceptance.     Dental Advisory Given  Plan Discussed with: CRNA and Surgeon  Anesthesia Plan Comments:        Anesthesia Quick Evaluation

## 2024-04-19 NOTE — Anesthesia Procedure Notes (Addendum)
 Procedure Name: Intubation Date/Time: 04/19/2024 7:33 AM  Performed by: Niki Barter, CRNAPre-anesthesia Checklist: Patient identified, Emergency Drugs available, Suction available and Patient being monitored Patient Re-evaluated:Patient Re-evaluated prior to induction Oxygen Delivery Method: Circle system utilized Preoxygenation: Pre-oxygenation with 100% oxygen Induction Type: IV induction Ventilation: Mask ventilation without difficulty Laryngoscope Size: McGrath and 3 Grade View: Grade I Tube type: Oral Tube size: 6.5 mm Number of attempts: 1 Airway Equipment and Method: Stylet Placement Confirmation: ETT inserted through vocal cords under direct vision, positive ETCO2 and breath sounds checked- equal and bilateral Secured at: 21 cm Tube secured with: Tape Dental Injury: Teeth and Oropharynx as per pre-operative assessment

## 2024-04-19 NOTE — Progress Notes (Signed)
 Patient has received a liter of fluid IV, 620ml PO. She has vomit about . She is up walking around trying to get her bladder to wake up. Bladder scanned her for a second time she currently has in her bladder. Encouraged more PO intake and ambulation. Daughter and husband at her side and our very supportive

## 2024-04-19 NOTE — Transfer of Care (Signed)
 Immediate Anesthesia Transfer of Care Note  Patient: Nancy Walls  Procedure(s) Performed: HYSTERECTOMY, VAGINAL, LAPAROSCOPY-ASSISTED, WITH SALPINGO-OOPHORECTOMY (Right)  Patient Location: PACU  Anesthesia Type:General  Level of Consciousness: awake, drowsy, and patient cooperative  Airway & Oxygen Therapy: Patient Spontanous Breathing and Patient connected to face mask oxygen  Post-op Assessment: Report given to RN and Post -op Vital signs reviewed and stable  Post vital signs: Reviewed and stable  Last Vitals:  Vitals Value Taken Time  BP 110/65 04/19/24 0949  Temp 36.7 C 04/19/24 0949  Pulse 75 04/19/24 0954  Resp 8 04/19/24 0954  SpO2 100 % 04/19/24 0954  Vitals shown include unfiled device data.  Last Pain:  Vitals:   04/19/24 0949  TempSrc:   PainSc: 0-No pain      Patients Stated Pain Goal: 0 (04/19/24 4098)  Complications: No notable events documented.

## 2024-04-19 NOTE — Op Note (Signed)
 OPERATIVE NOTE 04/19/2024 9:28 AM  PRE-OPERATIVE DIAGNOSIS:  1) Pelvic pain dysmenorrhea dyspareunia pelvic congestion syndrome  POST-OPERATIVE DIAGNOSIS:  Post-Op Diagnosis Codes:    * Pelvic pain [R10.2]    * Menometrorrhagia [N92.1]    * Pelvic congestion syndrome [N94.89]    * Chronic RLQ pain [R10.31, G89.29]    * Dyspareunia, female [N94.10]  OPERATION: Procedure(s) (LRB): HYSTERECTOMY, VAGINAL, LAPAROSCOPY-ASSISTED, WITH SALPINGO-OOPHORECTOMY (Right)    SURGEON(S): Surgeons and Role:    Zenobia Hila, MD - Primary    * Sofia Dunn, MD - Assisting   ANESTHESIA: Choice  ESTIMATED BLOOD LOSS: 220 mL   SPECIMEN:  ID Type Source Tests Collected by Time Destination  1 : Uterus, Cervix, BIlateral Fallopian Tubes and Right Ovary Tissue Path Tissue SURGICAL PATHOLOGY Zenobia Hila, MD 04/19/2024 [phone]     COMPLICATIONS: None  DRAINS: None  DISPOSITION: Stable to recovery room  DESCRIPTION OF PROCEDURE:      The patient was prepped and draped in the dorsolithotomy position and placed under general anesthesia. The bladder was emptied. The cervix was grasped with a multi-toothed tenaculum and a uterine manipulator was placed within the cervical os respecting the position and curvature of the uterus. After changing gloves we proceeded abdominally. A small infraumbilical incision was made and a 5 mm trocar port was placed within the abdominopelvic cavity. The opening pressure was less than 7 mmHg.  Approximately 3 and 1/2 L of carbon dioxide gas was instilled within the abdominal pelvic cavity. The laparoscope was placed and the pelvis and abdomen were carefully inspected. In the usual manner, under direct visualization right and left lower quadrant ports of 5 mm size were placed. Both ureters were identified in the pelvis prior to dissection or clamping and cutting of pedicles. The infundibulopelvic ligament on the right side was carefully identified. The ureters  were again identified out of the operative field. The ligament was triply coagulated and divided. Hemostasis of the pedicle was noted. The fallopian tubes were elevated and the mesenteric side systematically coagulated and divided allowing the tube to be removed at the time of uterine removal. The round ligaments were coagulated and divided and a bladder flap was created. The upper aspect of the broad ligament was clamped coagulated and divided. The uterine arteries were skeletonized, triply coagulated and divided. Careful inspection of all pedicles and the remainder of the pelvis was performed. Hemostasis was noted. The lower quadrant ports were removed, hemostasis of the port sites was noted, and the incisions were closed in subcuticular manner. The laparoscope and trocar sleeve were removed from the infraumbilical incision, hemostasis was noted, and the incision was closed in a subcuticular manner. A long-acting anesthetic was employed in the skin incisions. We then proceeded vaginally. A weighted speculum was placed posteriorly. A multi-toothed tenaculum was used to grasp the cervix and the cervix was injected in a circumferential manner with a dilute Pitressin solution. An incision was made around the cervix and the vaginal mucosa was dissected off of the cervix. The posterior cul-de-sac was identified and entered and the weighted speculum was placed within this. The anterior cul-de-sac was identified and entered and a retractor was placed and used to retract the bladder anteriorly keeping it out of the operative field. The uterosacral ligaments were clamped divided and suture ligated. The cardinal ligaments were clamped divided and suture ligated. The small remaining pedicle was clamped divided and suture ligated bilaterally allowing delivery of the specimen. Angle sutures  were placed in the usual manner. A culdoplasty was performed. The peritoneum was identified anteriorly and then incorporating the left  upper pedicle left lower pedicle right lower pedicle right upper pedicle and anterior peritoneum a pursestring suture was placed exteriorizing all pedicles. Hemostasis of all pedicles was noted at this time. The vaginal mucosa was then closed with a running suture of Vicryl. The patient went to recovery room in stable condition.  Dr. Dell Fennel provided exposure, dissection, suctioning, retraction, and general support and assistance during the procedure.  Delice Felt, M.D. 04/19/2024 9:28 AM

## 2024-04-20 ENCOUNTER — Encounter: Payer: Self-pay | Admitting: Obstetrics and Gynecology

## 2024-04-20 LAB — SURGICAL PATHOLOGY

## 2024-04-20 NOTE — Anesthesia Postprocedure Evaluation (Signed)
 Anesthesia Post Note  Patient: CAMIA DIPINTO  Procedure(s) Performed: HYSTERECTOMY, VAGINAL, LAPAROSCOPY-ASSISTED, WITH BILATERAL SALPINGECTOMY AND RIGHT OOPHORECTOMY (Abdomen)  Patient location during evaluation: PACU Anesthesia Type: General Level of consciousness: awake and alert Pain management: pain level controlled Vital Signs Assessment: post-procedure vital signs reviewed and stable Respiratory status: spontaneous breathing, nonlabored ventilation and respiratory function stable Cardiovascular status: blood pressure returned to baseline and stable Postop Assessment: no apparent nausea or vomiting Anesthetic complications: no   No notable events documented.   Last Vitals:  Vitals:   04/19/24 1057 04/19/24 1534  BP: 97/66 100/78  Pulse: 81 78  Resp: 16   Temp: (!) 36.2 C   SpO2: 99%     Last Pain:  Vitals:   04/19/24 1534  TempSrc:   PainSc: 0-No pain                 Baltazar Bonier

## 2024-04-21 ENCOUNTER — Ambulatory Visit: Payer: Self-pay

## 2024-04-27 ENCOUNTER — Encounter: Payer: Self-pay | Admitting: Obstetrics and Gynecology

## 2024-04-27 ENCOUNTER — Ambulatory Visit: Admitting: Obstetrics and Gynecology

## 2024-04-27 VITALS — BP 106/66 | HR 62 | Ht 62.0 in | Wt 115.6 lb

## 2024-04-27 DIAGNOSIS — N390 Urinary tract infection, site not specified: Secondary | ICD-10-CM

## 2024-04-27 DIAGNOSIS — Z4889 Encounter for other specified surgical aftercare: Secondary | ICD-10-CM

## 2024-04-27 DIAGNOSIS — R300 Dysuria: Secondary | ICD-10-CM | POA: Diagnosis not present

## 2024-04-27 DIAGNOSIS — Z9889 Other specified postprocedural states: Secondary | ICD-10-CM

## 2024-04-27 LAB — POCT URINALYSIS DIPSTICK
Bilirubin, UA: NEGATIVE
Glucose, UA: NEGATIVE
Ketones, UA: NEGATIVE
Nitrite, UA: NEGATIVE
Protein, UA: NEGATIVE
Spec Grav, UA: 1.01 (ref 1.010–1.025)
Urobilinogen, UA: 0.2 U/dL
pH, UA: 5 (ref 5.0–8.0)

## 2024-04-27 MED ORDER — NITROFURANTOIN MONOHYD MACRO 100 MG PO CAPS: 100.0000 mg | ORAL_CAPSULE | Freq: Two times a day (BID) | ORAL | 0 refills | Status: DC

## 2024-04-27 NOTE — Progress Notes (Signed)
 Patient presents for 1 week postop follow-up following LAVH w/ right oophorectomy. She states occasional pain in the evenings, takes medication PRN. Complaints of pressure and dysuria at this time.

## 2024-04-27 NOTE — Progress Notes (Signed)
 HPI:      [name] is a 38 y.o. [id number] who LMP was Patient's last menstrual period was 02/20/2024 (exact date).  Subjective:   She presents today 1 week post-op from LAVH and R oophor.  She reports she is doing well.  Denies bleeding.  Is having some pressure with urination especially at night. Taking few pain pills.    Hx: The following portions of the patient's history were reviewed and updated as appropriate:             She  has a past medical history of ADHD, Anxiety, Depression, Dysmenorrhea, Dyspareunia in female, Family history of breast cancer, GERD (gastroesophageal reflux disease), History of ovarian cyst, and Pelvic congestion syndrome. She does not have any pertinent problems on file. She  has a past surgical history that includes Tubal ligation; Other surgical history (08/14/2022); and Laparoscopic vaginal hysterectomy with salpingo oophorectomy (N/A, 04/19/2024). Her family history includes Breast cancer in some other family members; Diabetes in her father; Heart failure in her father; Mental illness in her mother. She  reports that she has never smoked. She has never been exposed to tobacco smoke. She has never used smokeless tobacco. She reports current alcohol use. She reports that she does not use drugs. She has a current medication list which includes the following prescription(s): cetirizine, hydrocodone -acetaminophen , ibuprofen, nitrofurantoin  (macrocrystal-monohydrate), and trazodone . She has no known allergies.       Review of Systems:  Review of Systems  Constitutional: Denied constitutional symptoms, night sweats, recent illness, fatigue, fever, insomnia and weight loss.  Eyes: Denied eye symptoms, eye pain, photophobia, vision change and visual disturbance.  Ears/Nose/Throat/Neck: Denied ear, nose, throat or neck symptoms, hearing loss, nasal discharge, sinus congestion and sore throat.  Cardiovascular: Denied cardiovascular symptoms, arrhythmia, chest  pain/pressure, edema, exercise intolerance, orthopnea and palpitations.  Respiratory: Denied pulmonary symptoms, asthma, pleuritic pain, productive sputum, cough, dyspnea and wheezing.  Gastrointestinal: Denied, gastro-esophageal reflux, melena, nausea and vomiting.  Genitourinary: Denied genitourinary symptoms including symptomatic vaginal discharge, pelvic relaxation issues, and urinary complaints.  Musculoskeletal: Denied musculoskeletal symptoms, stiffness, swelling, muscle weakness and myalgia.  Dermatologic: Denied dermatology symptoms, rash and scar.  Neurologic: Denied neurology symptoms, dizziness, headache, neck pain and syncope.  Psychiatric: Denied psychiatric symptoms, anxiety and depression.  Endocrine: Denied endocrine symptoms including hot flashes and night sweats.   Meds:   Current Outpatient Medications on File Prior to Visit  Medication Sig Dispense Refill   cetirizine (ZYRTEC) 10 MG tablet Take 10 mg by mouth at bedtime.     HYDROcodone -acetaminophen  (NORCO/VICODIN) 5-325 MG tablet Take 1-2 tablets by mouth every 6 (six) hours as needed for moderate pain (pain score 4-6). 25 tablet 0   ibuprofen (ADVIL) 200 MG tablet Take 400 mg by mouth every 8 (eight) hours as needed (pain.).     traZODone  (DESYREL ) 50 MG tablet Take 1 tablet (50 mg total) by mouth at bedtime. 90 tablet 1   No current facility-administered medications on file prior to visit.      Objective:      Vitals:   04/27/24 1449  BP: 106/66  Pulse: 62   Filed Weights   04/27/24 1449  Weight: 115 lb 9.6 oz (52.4 kg)               Abdomen: Soft.  Non-tender.  No masses.  No HSM.  Incision/s: Intact.  Healing well.  No erythema.  No drainage.  Assessment:     G2P2002 Patient Active Problem List   Diagnosis Date Noted   Irregular periods/menstrual cycles 07/05/2022   Pelvic congestion syndrome 07/05/2022   History of ovarian cyst 04/29/2022   Anxiety and depression 06/19/2018      1. Postoperative state   2. Dysuria   3. Postoperative UTI (urinary tract infection)     Possible UTI post op - otherwise well.   Plan:            1.  Discussed wound care and use of Motrin.  2.  Will send urine for C&S and Rx Macrobid .  Orders Orders Placed This Encounter  Procedures   Urine Culture   POCT Urinalysis Dipstick     Meds ordered this encounter  Medications   nitrofurantoin , macrocrystal-monohydrate, (MACROBID ) 100 MG capsule    Sig: Take 1 capsule (100 mg total) by mouth 2 (two) times daily.    Dispense:  14 capsule    Refill:  0      F/U  Return in about 5 weeks (around 06/01/2024).  Delice Felt, M.D. 04/27/2024 3:39 PM

## 2024-05-04 ENCOUNTER — Ambulatory Visit: Payer: Self-pay

## 2024-05-08 ENCOUNTER — Encounter: Payer: Self-pay | Admitting: Obstetrics and Gynecology

## 2024-05-08 DIAGNOSIS — Z9889 Other specified postprocedural states: Secondary | ICD-10-CM

## 2024-05-08 DIAGNOSIS — N9989 Other postprocedural complications and disorders of genitourinary system: Secondary | ICD-10-CM

## 2024-05-08 DIAGNOSIS — R300 Dysuria: Secondary | ICD-10-CM

## 2024-05-10 MED ORDER — NITROFURANTOIN MONOHYD MACRO 100 MG PO CAPS: 100.0000 mg | ORAL_CAPSULE | Freq: Two times a day (BID) | ORAL | 0 refills | Status: DC

## 2024-05-18 ENCOUNTER — Encounter: Payer: Self-pay | Admitting: Obstetrics and Gynecology

## 2024-06-01 ENCOUNTER — Ambulatory Visit: Admitting: Obstetrics and Gynecology

## 2024-06-01 ENCOUNTER — Encounter: Payer: Self-pay | Admitting: Obstetrics and Gynecology

## 2024-06-01 VITALS — BP 110/69 | HR 67 | Ht 62.0 in | Wt 114.7 lb

## 2024-06-01 DIAGNOSIS — Z4889 Encounter for other specified surgical aftercare: Secondary | ICD-10-CM

## 2024-06-01 DIAGNOSIS — Z9889 Other specified postprocedural states: Secondary | ICD-10-CM

## 2024-06-01 NOTE — Progress Notes (Signed)
 Patient presents for 6 week postop follow-up following LAVH/RSO. She states no longer bleeding or having pain.

## 2024-06-01 NOTE — Progress Notes (Signed)
 HPI:      [name] is a 38 y.o. [id number] who LMP was Patient's last menstrual period was 02/20/2024 (exact date).  Subjective:   She presents today 6 weeks from [facility] with unilateral oophorectomy.  She reports she is doing well.  She denies any vaginal bleeding.  She says her UTI is now resolved and her pelvic pain is resolved. She does state that she does a job where she lifts 50 pounds on a regular basis every day and wants to know if it is appropriate to go back to work yet.  She also has questions around swimming and bathing.    Hx: The following portions of the patient's history were reviewed and updated as appropriate:             She  has a past medical history of ADHD, Anxiety, Depression, Dysmenorrhea, Dyspareunia in female, Family history of breast cancer, GERD (gastroesophageal reflux disease), History of ovarian cyst, and Pelvic congestion syndrome. She does not have any pertinent problems on file. She  has a past surgical history that includes Tubal ligation; Other surgical history (08/14/2022); and Laparoscopic vaginal hysterectomy with salpingo oophorectomy (N/A, 04/19/2024). Her family history includes Breast cancer in some other family members; Diabetes in her father; Heart failure in her father; Mental illness in her mother. She  reports that she has never smoked. She has never been exposed to tobacco smoke. She has never used smokeless tobacco. She reports current alcohol use. She reports that she does not use drugs. She has a current medication list which includes the following prescription(s): cetirizine, ibuprofen, and trazodone . She has no known allergies.       Review of Systems:  Review of Systems  Constitutional: Denied constitutional symptoms, night sweats, recent illness, fatigue, fever, insomnia and weight loss.  Eyes: Denied eye symptoms, eye pain, photophobia, vision change and visual disturbance.  Ears/Nose/Throat/Neck: Denied ear, nose, throat or neck  symptoms, hearing loss, nasal discharge, sinus congestion and sore throat.  Cardiovascular: Denied cardiovascular symptoms, arrhythmia, chest pain/pressure, edema, exercise intolerance, orthopnea and palpitations.  Respiratory: Denied pulmonary symptoms, asthma, pleuritic pain, productive sputum, cough, dyspnea and wheezing.  Gastrointestinal: Denied, gastro-esophageal reflux, melena, nausea and vomiting.  Genitourinary: Denied genitourinary symptoms including symptomatic vaginal discharge, pelvic relaxation issues, and urinary complaints.  Musculoskeletal: Denied musculoskeletal symptoms, stiffness, swelling, muscle weakness and myalgia.  Dermatologic: Denied dermatology symptoms, rash and scar.  Neurologic: Denied neurology symptoms, dizziness, headache, neck pain and syncope.  Psychiatric: Denied psychiatric symptoms, anxiety and depression.  Endocrine: Denied endocrine symptoms including hot flashes and night sweats.   Meds:   Current Outpatient Medications on File Prior to Visit  Medication Sig Dispense Refill   cetirizine (ZYRTEC) 10 MG tablet Take 10 mg by mouth at bedtime.     ibuprofen (ADVIL) 200 MG tablet Take 400 mg by mouth every 8 (eight) hours as needed (pain.).     traZODone  (DESYREL ) 50 MG tablet Take 1 tablet (50 mg total) by mouth at bedtime. 90 tablet 1   No current facility-administered medications on file prior to visit.      Objective:     Vitals:   06/01/24 1433  BP: 110/69  Pulse: 67   Filed Weights   06/01/24 1433  Weight: 114 lb 11.2 oz (52 kg)              Physical examination   Pelvic:   Vulva: Normal appearance.  No lesions.  Vagina: No lesions or abnormalities noted.  Vaginal cuff healing well but suture still present after the cuff  Support: Normal pelvic support.  Urethra No masses tenderness or scarring.  Meatus Normal size without lesions or prolapse.  Cervix: Surgically absent  Anus: Normal exam.  No lesions.  Perineum: Normal exam.   No lesions.            Assessment:    G2P2002 Patient Active Problem List   Diagnosis Date Noted   Irregular periods/menstrual cycles 07/05/2022   Pelvic congestion syndrome 07/05/2022   History of ovarian cyst 04/29/2022   Anxiety and depression 06/19/2018     1. Postoperative state     Patient with excellent postop recovery.  Not ready for intercourse for pool or tub bathing  Patient may not report to work for at least 4 more weeks because of the extensive heavy lifting involved in her work.   Plan:            1.  Notes given regarding work  2.  Follow-up in 3 months. Orders No orders of the defined types were placed in this encounter.   No orders of the defined types were placed in this encounter.     F/U  Return in about 3 months (around 09/01/2024).  Alm DOROTHA Sar, M.D. 06/01/2024 3:12 PM

## 2024-06-03 ENCOUNTER — Encounter: Payer: Self-pay | Admitting: Obstetrics and Gynecology

## 2024-06-04 ENCOUNTER — Encounter: Payer: Self-pay | Admitting: Advanced Practice Midwife

## 2024-07-22 ENCOUNTER — Encounter: Payer: Self-pay | Admitting: Family Medicine

## 2024-08-04 ENCOUNTER — Ambulatory Visit: Admitting: Family Medicine

## 2024-08-04 ENCOUNTER — Encounter: Payer: Self-pay | Admitting: Family Medicine

## 2024-08-04 VITALS — BP 113/78 | HR 76 | Resp 16 | Ht 62.0 in | Wt 113.0 lb

## 2024-08-04 DIAGNOSIS — Z23 Encounter for immunization: Secondary | ICD-10-CM

## 2024-08-04 DIAGNOSIS — Z0000 Encounter for general adult medical examination without abnormal findings: Secondary | ICD-10-CM

## 2024-08-04 NOTE — Patient Instructions (Signed)
 Health Maintenance Recommendations Screening Testing Mammogram Every 1 -2 years based on history and risk factors Starting at age 38 Pap Smear Ages 21-39 every 3 years Ages 74-65 every 5 years with HPV testing More frequent testing may be required based on results and history Colon Cancer Screening Every 1-10 years based on test performed, risk factors, and history Starting at age 63 Bone Density Screening Every 2-10 years based on history Starting at age 41 for women Recommendations for men differ based on medication usage, history, and risk factors AAA Screening One time ultrasound Men 61-21 years old who have every smoked Lung Cancer Screening Low Dose Lung CT every 12 months Age 75-80 years with a 30 pack-year smoking history who still smoke or who have quit within the last 15 years   Screening Labs Routine  Labs: Complete Blood Count (CBC), Complete Metabolic Panel (CMP), Cholesterol (Lipid Panel) Every 6-12 months based on history and medications May be recommended more frequently based on current conditions or previous results Hemoglobin A1c Lab Every 3-12 months based on history and previous results Starting at age 37 or earlier with diagnosis of diabetes, high cholesterol, BMI >26, and/or risk factors Frequent monitoring for patients with diabetes to ensure blood sugar control Thyroid Panel (TSH w/ T3 & T4) Every 6 months based on history, symptoms, and risk factors May be repeated more often if on medication HIV One time testing for all patients 79 and older May be repeated more frequently for patients with increased risk factors or exposure Hepatitis C One time testing for all patients 64 and older May be repeated more frequently for patients with increased risk factors or exposure Gonorrhea, Chlamydia Every 12 months for all sexually active persons 13-24 years Additional monitoring may be recommended for those who are considered high risk or who have  symptoms PSA Men 22-71 years old with risk factors Additional screening may be recommended from age 78-69 based on risk factors, symptoms, and history   Vaccine Recommendations Tetanus Booster All adults every 10 years Flu Vaccine All patients 6 months and older every year COVID Vaccine All patients 12 years and older Initial dosing with booster May recommend additional booster based on age and health history HPV Vaccine 2 doses all patients age 86-26 Dosing may be considered for patients over 26 Shingles Vaccine (Shingrix) 2 doses all adults 55 years and older Pneumonia (Pneumovax 23) All adults 65 years and older May recommend earlier dosing based on health history Pneumonia (Prevnar 45) All adults 65 years and older Dosed 1 year after Pneumovax 23   Additional Screening, Testing, and Vaccinations may be recommended on an individualized basis based on family history, health history, risk factors, and/or exposure.  __________________________________________________________   Diet Recommendations for All Patients   I recommend that all patients maintain a diet low in saturated fats, carbohydrates, and cholesterol. While this can be challenging at first, it is not impossible and small changes can make big differences.  Things to try: Decreasing the amount of soda, sweet tea, and/or juice to one or less per day and replace with water While water is always the first choice, if you do not like water you may consider adding a water additive without sugar to improve the taste other sugar free drinks Replace potatoes with a brightly colored vegetable at dinner Use healthy oils, such as canola oil or olive oil, instead of butter or hard margarine Limit your bread intake to two pieces or less a day Replace regular pasta with  low carb pasta options Bake, broil, or grill foods instead of frying Monitor portion sizes  Eat smaller, more frequent meals throughout the day instead of large  meals   An important thing to remember is, if you love foods that are not great for your health, you don't have to give them up completely. Instead, allow these foods to be a reward when you have done well. Allowing yourself to still have special treats every once in a while is a nice way to tell yourself thank you for working hard to keep yourself healthy.    Also remember that every day is a new day. If you have a bad day and fall off the wagon, you can still climb right back up and keep moving along on your journey!   We have resources available to help you!  Some websites that may be helpful include: www.http://www.wall-moore.info/        Www.[employer]Well[employer].com _____________________________________________________________   Activity Recommendations for All Patients   I recommend that all adults get at least 20 minutes of moderate physical activity that elevates your heart rate at least 5 days out of the week.  Some examples include: Walking or jogging at a pace that allows you to carry on a conversation Cycling (stationary bike or outdoors) Water aerobics Yoga Weight lifting Dancing If physical limitations prevent you from putting stress on your joints, exercise in a pool or seated in a chair are excellent options.   Do determine your MAXIMUM heart rate for activity: YOUR AGE - 220 = MAX HeartRate    Remember! Do not push yourself too hard.  Start slowly and build up your pace, speed, weight, time in exercise, etc.  Allow your body to rest between exercise and get good sleep. You will need more water than normal when you are exerting yourself. Do not wait until you are thirsty to drink. Drink with a purpose of getting in at least 8, 8 ounce glasses of water a day plus more depending on how much you exercise and sweat.      If you begin to develop dizziness, chest pain, abdominal pain, jaw pain, shortness of breath, headache, vision changes, lightheadedness, or other concerning symptoms, stop the  activity and allow your body to rest. If your symptoms are severe, seek emergency evaluation immediately. If your symptoms are concerning, but not severe, please let us  know so that we can recommend further evaluation.  MyChart:  For all urgent or time sensitive needs we ask that you please call the office to avoid delays. Our number is [phone]) [phone]. MyChart is not constantly monitored and due to the large volume of messages a day, replies may take up to 72 business hours.   MyChart Policy: MyChart allows for you to see your visit notes, after visit summary, provider recommendations, lab and tests results, make an appointment, request refills, and contact your provider or the office for non-urgent questions or concerns. Providers are seeing patients during normal business hours and do not have built in time to review MyChart messages.  We ask that you allow a minimum of 3 business days for responses to [employer]. For this reason, please do not send urgent requests through MyChart. Please call the office at [phone]. New and ongoing conditions may require a visit. We have virtual and in person visit available for your convenience.  Complex MyChart concerns may require a visit. Your provider may request you schedule a virtual or in person visit to ensure we  are providing the best care possible. MyChart messages sent after 11:00 AM on Friday will not be received by the provider until Monday morning.    Lab and Test Results: You will receive your lab and test results on MyChart as soon as they are completed and results have been sent by the lab or testing facility. Due to this service, you will receive your results BEFORE your provider.  I review lab and tests results each morning prior to seeing patients. Some results require collaboration with other providers to ensure you are receiving the most appropriate care. For this reason, we ask that you please allow a minimum of 3-5 business days  from the time the ALL results have been received for your provider to receive and review lab and test results and contact you about these.  Most lab and test result comments from the provider will be sent through MyChart. Your provider may recommend changes to the plan of care, follow-up visits, repeat testing, ask questions, or request an office visit to discuss these results. You may reply directly to this message or call the office at [phone] to provide information for the provider or set up an appointment. In some instances, you will be called with test results and recommendations. Please let us  know if this is preferred and we will make note of this in your chart to provide this for you.    If you have not heard a response to your lab or test results in 5 business days from all results returning to MyChart, please call the office to let us  know. We ask that you please avoid calling prior to this time unless there is an emergent concern. Due to high call volumes, this can delay the resulting process.   After Hours: For all non-emergency after hours needs, please call the office at [phone] and select the option to reach the on-call provider service. On-call services are shared between multiple [facility] offices and therefore it will not be possible to speak directly with your provider. On-call providers may provide medical advice and recommendations, but are unable to provide refills for maintenance medications.  For all emergency or urgent medical needs after normal business hours, we recommend that you seek care at the closest Urgent Care or Emergency Department to ensure appropriate treatment in a timely manner.  [facility] has a 24 hour emergency room located on the ground floor for your convenience.    Urgent Concerns During the Business Day Providers are seeing patients from 8AM to 5PM, Monday through Thursday, and 8AM to 12PM on Friday with a busy schedule  and are most often not able to respond to non-urgent calls until the end of the day or the next business day. If you should have URGENT concerns during the day, please call and speak to the nurse or schedule a same day appointment so that we can address your concern without delay.    Thank you, again, for choosing me as your health care partner. I appreciate your trust and look forward to learning more about you.    Evalene Arts, FNP-C

## 2024-08-04 NOTE — Progress Notes (Signed)
 Subjective:   Nancy Walls [birth year]  08/04/2024   CC: Chief Complaint  Patient presents with   Annual Exam   HPI: Nancy Walls is a 38 y.o. female who presents for a routine health maintenance exam.   HEALTH SCREENINGS: - Vision Screening: up to date - Dental Visits: up to date - Pap smear: TAH - Breast Exam: not applicable - STD Screening: Declined - Mammogram (40+): Not applicable  - Colonoscopy (45+): Not applicable  - Bone Density (65+ or under 65 with predisposing conditions): Not applicable  - Lung CA screening with low-dose CT:  Not applicable Adults age 77-80 who are current cigarette smokers or quit within the last 15 years. Must have 20 pack year history.   Depression and Anxiety Screen done today and results listed below:     08/04/2024    3:20 PM 03/01/2024    3:29 PM 01/16/2024    9:49 AM  Depression screen PHQ 2/9  Decreased Interest 0 0 0  Down, Depressed, Hopeless 0 0 0  PHQ - 2 Score 0 0 0  Altered sleeping 2 1 3   Tired, decreased energy 0 1 1  Change in appetite 0 0 0  Feeling bad or failure about yourself  0 0 0  Trouble concentrating 0 0 0  Moving slowly or fidgety/restless 0 0 0  Suicidal thoughts 0 0 0  PHQ-9 Score 2 2 4   Difficult doing work/chores Not difficult at all Somewhat difficult Somewhat difficult      08/04/2024    3:20 PM 03/01/2024    3:30 PM 01/16/2024    9:49 AM  GAD 7 : Generalized Anxiety Score  Nervous, Anxious, on Edge 0 0 0  Control/stop worrying 0 0 0  Worry too much - different things 0 0 0  Trouble relaxing 0 0 0  Restless 0 0 0  Easily annoyed or irritable 0 0 0  Afraid - awful might happen 0 0 0  Total GAD 7 Score 0 0 0  Anxiety Difficulty Not difficult at all Not difficult at all Not difficult at all    IMMUNIZATIONS: - Tdap: Tetanus vaccination status reviewed: last tetanus booster within 10 years. - HPV: Refused - Influenza: Refused - Pneumovax: Not applicable - Prevnar 20: Not applicable -  Zostavax (50+): Not applicable   Past medical history, surgical history, medications, allergies, family history and social history reviewed with patient today and changes made to appropriate areas of the chart.   Past Medical History:  Diagnosis Date   ADHD    Anxiety    Depression    Dysmenorrhea    Dyspareunia in female    Family history of breast cancer    Paternal and Maternal side   GERD (gastroesophageal reflux disease)    History of ovarian cyst    Pelvic congestion syndrome     Past Surgical History:  Procedure Laterality Date   LAPAROSCOPIC VAGINAL HYSTERECTOMY WITH SALPINGO OOPHORECTOMY N/A 04/19/2024   Procedure: HYSTERECTOMY, VAGINAL, LAPAROSCOPY-ASSISTED, WITH BILATERAL SALPINGECTOMY AND RIGHT OOPHORECTOMY;  Surgeon: Janit Alm Agent, MD;  Location: [facility] OR[facility];  Service: Gynecology;  Laterality: N/A;  LAVH RSO LEFT SALPINGECTOMY   OTHER SURGICAL HISTORY  08/14/2022   IR Embolization Venous   TUBAL LIGATION      Current Outpatient Medications on File Prior to Visit  Medication Sig   cetirizine (ZYRTEC) 10 MG tablet Take 10 mg by mouth at bedtime.   ibuprofen (ADVIL) 200 MG tablet Take 400 mg by mouth  every 8 (eight) hours as needed (pain.).   traZODone  (DESYREL ) 50 MG tablet Take 1 tablet (50 mg total) by mouth at bedtime.   No current facility-administered medications on file prior to visit.    No Known Allergies   Social History   Socioeconomic History   Marital status: Married    Spouse name: Not on file   Number of children: Not on file   Years of education: Not on file   Highest education level: Not on file  Occupational History   Not on file  Tobacco Use   Smoking status: Never    Passive exposure: Never   Smokeless tobacco: Never  Vaping Use   Vaping status: Never Used  Substance and Sexual Activity   Alcohol use: Yes    Comment: occ   Drug use: Never   Sexual activity: Not Currently    Birth control/protection: Surgical    Comment:  LAVH/BTL  Other Topics Concern   Not on file  Social History Narrative   Not on file   Social Drivers of Health   Financial Resource Strain: Low Risk  (04/29/2022)   Received [facility]   Overall Financial Resource Strain (CARDIA)    Difficulty of Paying Living Expenses: Not hard at all  Food Insecurity: No Food Insecurity (04/29/2022)   Received from [facility]   Hunger Vital Sign    Within the past 12 months, you worried that your food would run out before you got the money to buy more.: Never true    Within the past 12 months, the food you bought just didn't last and you didn't have money to get more.: Never true  Transportation Needs: No Transportation Needs (04/29/2022)   Received from [facility]   PRAPARE - Transportation    Lack of Transportation (Medical): No    Lack of Transportation (Non-Medical): No  Physical Activity: Not on file  Stress: Not on file  Social Connections: Not on file  Intimate Partner Violence: Not on file   Social History   Tobacco Use  Smoking Status Never   Passive exposure: Never  Smokeless Tobacco Never   Social History   Substance and Sexual Activity  Alcohol Use Yes   Comment: occ    Family History  Problem Relation Age of Onset   Mental illness Mother    Diabetes Father    Heart failure Father    Breast cancer Other        20s   Breast cancer Other      ROS: Denies fever, fatigue, unexplained weight loss/gain, chest pain, SHOB, and palpitations. Denies neurological deficits, gastrointestinal or genitourinary complaints, and skin changes.   Objective:   Today's Vitals   08/04/24 1525  BP: 113/78  Pulse: 76  Resp: 16  SpO2: 98%  Weight: 113 lb (51.3 kg)  Height: 5' 2 (1.575 m)  PainSc: 0-No pain    GENERAL APPEARANCE: Well-appearing, in NAD. Well nourished.  SKIN: Pink, warm and dry. Turgor normal. No rash, lesion, ulceration, or ecchymoses. Hair evenly distributed.  HEENT: HEAD: Normocephalic.   EYES: PERRLA. EOMI. Lids intact w/o defect. Sclera white, Conjunctiva pink w/o exudate.  EARS: External ear w/o redness, swelling, masses or lesions. EAC clear. TM's intact, translucent w/o bulging, appropriate landmarks visualized. Appropriate acuity to conversational tones.  NOSE: Septum midline w/o deformity. Nares patent, mucosa pink and non-inflamed w/o drainage. No sinus tenderness.  THROAT: Uvula midline. Oropharynx clear. Tonsils non-inflamed w/o exudate. Oral mucosa pink  and moist.  NECK: Supple, Trachea midline. Full ROM w/o pain or tenderness. No lymphadenopathy. Thyroid non-tender w/o enlargement or palpable masses.  BREASTS: Deferred.  RESPIRATORY: Chest wall symmetrical w/o masses. Respirations even and non-labored. Breath sounds clear to auscultation bilaterally. No wheezes, rales, rhonchi, or crackles. CARDIAC: S1, S2 present, regular rate and rhythm. No gallops, murmurs, rubs, or clicks. PMI w/o lifts, heaves, or thrills. No carotid bruits. Capillary refill <2 seconds. Peripheral pulses 2+ bilaterally. GI: Abdomen soft w/o distention. Normoactive bowel sounds. No palpable masses or tenderness. No guarding or rebound tenderness. Liver and spleen w/o tenderness or enlargement. No CVA tenderness.  GU: Deferred.  MSK: Muscle tone and strength appropriate for age, w/o atrophy or abnormal movement.  EXTREMITIES: Active ROM intact, w/o tenderness, crepitus, or contracture. No obvious joint deformities or effusions. No clubbing, edema, or cyanosis.  NEUROLOGIC: CN's II-XII intact. Motor strength symmetrical with no obvious weakness. No sensory deficits. DTR's 2+ symmetric bilaterally. Steady, even gait.  PSYCH/MENTAL STATUS: Alert, oriented x 3. Cooperative, appropriate mood and affect.    Results for orders placed or performed in visit on 04/27/24  POCT Urinalysis Dipstick   Collection Time: 04/27/24  2:59 PM  Result Value Ref Range   Color, UA yellow    Clarity, UA clear     Glucose, UA Negative Negative   Bilirubin, UA neg    Ketones, UA neg    Spec Grav, UA 1.010 1.010 - 1.025   Blood, UA mod    pH, UA 5.0 5.0 - 8.0   Protein, UA Negative Negative   Urobilinogen, UA 0.2 0.2 or 1.0 E.U./dL   Nitrite, UA neg    Leukocytes, UA Trace (A) Negative   Appearance     Odor    Urine Culture   Collection Time: 04/27/24  3:05 PM   Specimen: Urine   UR  Result Value Ref Range   Urine Culture, Routine Final report (A)    Organism ID, Bacteria Comment (A)    Antimicrobial Susceptibility Comment     Assessment & Plan:   1. Wellness examination (Primary) - Encouraged a healthy well-balanced diet. Patient may adjust caloric intake to maintain or achieve ideal body weight. May reduce intake of dietary saturated fat and total fat and have adequate dietary potassium and calcium preferably from fresh fruits, vegetables, and low-fat dairy products.   - Advised to avoid cigarette smoking. - Discussed with the patient that most people either abstain from alcohol or drink within safe limits (<=14/week and <=4 drinks/occasion for males, <=7/weeks and <= 3 drinks/occasion for females) and that the risk for alcohol disorders and other health effects rises proportionally with the number of drinks per week and how often a drinker exceeds daily limits. - Discussed cessation/primary prevention of drug use and availability of treatment for abuse.  - Discussed sexually transmitted diseases, avoidance of unintended pregnancy and contraceptive alternatives. - Stressed the importance of regular exercise - Injury prevention: Discussed safety belts, safety helmets, smoke detector, smoking near bedding or upholstery.  - Dental health: Discussed importance of regular tooth brushing, flossing, and dental visits.  NEXT PREVENTATIVE PHYSICAL DUE IN 1 YEAR. - CBC with Differential/Platelet; Future - Comprehensive metabolic panel with GFR; Future - Hemoglobin A1c; Future - Lipid panel;  Future - TSH Rfx on Abnormal to Free T4; Future  2. Need for HPV vaccination Patient agreeable to receive dose #1 of HPV series. Discussed completion of second dose of HPV series in 2 months from first dose and third  dose of HPV in 6 months from the first dose.  - HPV 9-valent vaccine,Recombinat   Return in about 6 months (around 02/01/2025) for Mood f/u.  Patient to reach out to office if new, worrisome, or unresolved symptoms arise or if no improvement in patient's condition. Patient verbalized understanding and is agreeable to treatment plan. All questions answered to patient's satisfaction.   Evalene Arts, FNP

## 2024-08-09 ENCOUNTER — Other Ambulatory Visit: Payer: Self-pay | Admitting: Family Medicine

## 2024-08-09 DIAGNOSIS — Z0000 Encounter for general adult medical examination without abnormal findings: Secondary | ICD-10-CM

## 2024-08-10 ENCOUNTER — Encounter: Payer: Self-pay | Admitting: Family Medicine

## 2024-08-10 LAB — CBC WITH DIFFERENTIAL/PLATELET
Basophils Absolute: 0 x10E3/uL (ref 0.0–0.2)
Basos: 1 %
EOS (ABSOLUTE): 0.1 x10E3/uL (ref 0.0–0.4)
Eos: 2 %
Hematocrit: 42.6 % (ref 34.0–46.6)
Hemoglobin: 13.7 g/dL (ref 11.1–15.9)
Immature Grans (Abs): 0 x10E3/uL (ref 0.0–0.1)
Immature Granulocytes: 0 %
Lymphocytes Absolute: 1.1 x10E3/uL (ref 0.7–3.1)
Lymphs: 25 %
MCH: 30.2 pg (ref 26.6–33.0)
MCHC: 32.2 g/dL (ref 31.5–35.7)
MCV: 94 fL (ref 79–97)
Monocytes Absolute: 0.6 x10E3/uL (ref 0.1–0.9)
Monocytes: 12 %
Neutrophils Absolute: 2.7 x10E3/uL (ref 1.4–7.0)
Neutrophils: 60 %
Platelets: 220 x10E3/uL (ref 150–450)
RBC: 4.53 x10E6/uL (ref 3.77–5.28)
RDW: 12 % (ref 11.7–15.4)
WBC: 4.5 x10E3/uL (ref 3.4–10.8)

## 2024-08-10 LAB — COMPREHENSIVE METABOLIC PANEL WITH GFR
ALT: 13 IU/L (ref 0–32)
AST: 14 IU/L (ref 0–40)
Albumin: 4.7 g/dL (ref 3.9–4.9)
Alkaline Phosphatase: 36 IU/L — ABNORMAL LOW (ref 41–116)
BUN/Creatinine Ratio: 19 (ref 9–23)
BUN: 15 mg/dL (ref 6–20)
Bilirubin Total: 0.4 mg/dL (ref 0.0–1.2)
CO2: 21 mmol/L (ref 20–29)
Calcium: 9.6 mg/dL (ref 8.7–10.2)
Chloride: 100 mmol/L (ref 96–106)
Creatinine, Ser: 0.81 mg/dL (ref 0.57–1.00)
Globulin, Total: 2 g/dL (ref 1.5–4.5)
Glucose: 84 mg/dL (ref 70–99)
Potassium: 4.7 mmol/L (ref 3.5–5.2)
Sodium: 135 mmol/L (ref 134–144)
Total Protein: 6.7 g/dL (ref 6.0–8.5)
eGFR: 96 mL/min/1.73 (ref >59)

## 2024-08-10 LAB — LIPID PANEL
Chol/HDL Ratio: 2.2 ratio (ref 0.0–4.4)
Cholesterol, Total: 209 mg/dL — ABNORMAL HIGH (ref 100–199)
HDL: 97 mg/dL (ref >39)
LDL Chol Calc (NIH): 104 mg/dL — ABNORMAL HIGH (ref 0–99)
Triglycerides: 44 mg/dL (ref 0–149)
VLDL Cholesterol Cal: 8 mg/dL (ref 5–40)

## 2024-08-10 LAB — HEMOGLOBIN A1C
Est. average glucose Bld gHb Est-mCnc: 108 mg/dL
Hgb A1c MFr Bld: 5.4 % (ref 4.8–5.6)

## 2024-08-10 LAB — TSH RFX ON ABNORMAL TO FREE T4: TSH: 1.44 u[IU]/mL (ref 0.450–4.500)

## 2024-08-16 ENCOUNTER — Ambulatory Visit: Payer: Self-pay | Admitting: Family Medicine

## 2024-08-27 ENCOUNTER — Other Ambulatory Visit: Payer: Self-pay | Admitting: Family Medicine

## 2024-08-27 DIAGNOSIS — F5101 Primary insomnia: Secondary | ICD-10-CM

## 2024-08-30 ENCOUNTER — Encounter: Payer: Self-pay | Admitting: Family Medicine

## 2024-08-31 ENCOUNTER — Other Ambulatory Visit: Payer: Self-pay

## 2024-08-31 DIAGNOSIS — F5101 Primary insomnia: Secondary | ICD-10-CM

## 2024-08-31 MED ORDER — TRAZODONE HCL 50 MG PO TABS: 50.0000 mg | ORAL_TABLET | Freq: Every day | ORAL | 1 refills | Status: AC

## 2024-09-01 ENCOUNTER — Ambulatory Visit: Admitting: Obstetrics and Gynecology

## 2024-09-01 ENCOUNTER — Ambulatory Visit: Admitting: Obstetrics & Gynecology

## 2024-10-04 ENCOUNTER — Ambulatory Visit

## 2024-10-04 DIAGNOSIS — Z23 Encounter for immunization: Secondary | ICD-10-CM | POA: Diagnosis not present

## 2025-02-01 ENCOUNTER — Ambulatory Visit
